# Patient Record
Sex: Female | Born: 1960 | Hispanic: Yes | Marital: Single | Smoking: Never smoker
Health system: Southern US, Community
[De-identification: ages and names within clinical notes are randomized; demographics above are authoritative.]

## PROBLEM LIST (undated history)

## (undated) DIAGNOSIS — E119 Type 2 diabetes mellitus without complications: Secondary | ICD-10-CM

## (undated) DIAGNOSIS — E139 Other specified diabetes mellitus without complications: Secondary | ICD-10-CM

## (undated) DIAGNOSIS — I10 Essential (primary) hypertension: Secondary | ICD-10-CM

## (undated) HISTORY — PX: CHOLECYSTECTOMY: SHX55

## (undated) HISTORY — PX: ABDOMINAL HYSTERECTOMY: SHX81

---

## 2019-08-03 ENCOUNTER — Ambulatory Visit: Payer: Self-pay | Admitting: Family Medicine

## 2019-09-07 ENCOUNTER — Ambulatory Visit: Payer: Self-pay | Attending: Nurse Practitioner | Admitting: Nurse Practitioner

## 2019-09-07 ENCOUNTER — Other Ambulatory Visit: Payer: Self-pay

## 2020-08-07 ENCOUNTER — Encounter: Payer: Self-pay | Admitting: Internal Medicine

## 2020-08-07 ENCOUNTER — Ambulatory Visit: Payer: Self-pay | Admitting: Internal Medicine

## 2020-08-07 VITALS — BP 136/84 | HR 74 | Resp 12 | Ht <= 58 in | Wt 119.0 lb

## 2020-08-07 DIAGNOSIS — R079 Chest pain, unspecified: Secondary | ICD-10-CM

## 2020-08-07 DIAGNOSIS — E119 Type 2 diabetes mellitus without complications: Secondary | ICD-10-CM

## 2020-08-07 MED ORDER — ISOSORBIDE MONONITRATE ER 30 MG PO TB24: 30.0000 mg | ORAL_TABLET | Freq: Every day | ORAL | 4 refills | Status: DC

## 2020-08-07 MED ORDER — METFORMIN HCL ER 500 MG PO TB24: ORAL_TABLET | ORAL | 11 refills | Status: DC

## 2020-08-07 NOTE — Progress Notes (Signed)
Subjective:    Patient ID: Tanya Mason, female   DOB: 12/03/1961, 59 y.o.   MRN: [id number]   HPI   Here to establish Has only been in [place] for 5 days. Trinda Pascal interprets Poor historian  1.  DM:  Diagnosed 10 plus years ago.  Was taking Metformin, but states her doctor in [place] told her the Metformin was causing damage to her heart and switched to Jardiance 25 mg daily.  Has been taking now for 3 months.  Feels very shaky with the Jardiance.  Is checking her sugars:  105-160.  States has checked her sugar when she is shaky and her sugar has always been in a good range--not below 98. Did not have the shaking when on Metformin, but does not want to go back to plain Metformin.   [place] has been in [place].  Was living in [place].  2. Was having pain in RUQ and went to [place] in June to get a second opinion and ultimately had open cholecystectomy in [place] with resolution of pain.  States no one would do anything for her with this pain in [place].    3.  Heart concerns:  States she has a "blockage" in her heart.  Describes diagnosed with CXR.  Also in [place]  She is not aware of having an ECG.  Was placed on Isosorbide mononitrate 40 mg.  When returned to [place], was told she did not have CAD or blockage, so she stopped the Isosorbide.  She felt cold and had a headache, so she restarted and felt better.   4.  Hypercholesterolemia:  Was on medication at one point for this.  5.  No definite history of hypertension  6.  "spot on her lung" : again when returned to [place] and placed on antibiotics.  Cannot say when she finished or what the antibiotic was.  Not clear what symptoms she was having that led her to go to hospital.  Does not seem to be feeling better.   Later, states she had a cough, fever for 2 days prior to going to ED.  She is not having those symptoms.  She does have some back pain.      Current Meds  Medication Sig  . Cholecalciferol  (VITAMIN D3) 25 MCG (1000 UT) CAPS Take by mouth. 1 daily  . empagliflozin (JARDIANCE) 25 MG TABS tablet Take by mouth. 1 twice a day  . isosorbide dinitrate (ISORDIL) 40 MG tablet Take 40 mg by mouth. 1 daily  . OVER THE COUNTER MEDICATION daily. colitisil ( natural constipation tablet)   Allergies  Allergen Reactions  . Penicillins    Past Medical History:  Diagnosis Date  . Diabetes mellitus without complication (HCC)   . Hypertension    Past Surgical History:  Procedure Laterality Date  . ABDOMINAL HYSTERECTOMY    . CHOLECYSTECTOMY     Social History   Tobacco Use  . Smoking status: Never Smoker  . Smokeless tobacco: Never Used  Substance Use Topics  . Alcohol use: Never  . Drug use: Never      Review of Systems    Objective:   BP 136/84 (BP Location: Left Arm, Patient Position: Sitting, Cuff Size: Normal)   Pulse 74   Resp 12   Ht 4\' 10"  (1.473 m)   Wt 119 lb (54 kg)   LMP  (LMP Unknown)   BMI 24.87 kg/m   Physical Exam  NAD  HEENT:  PERRL, EOMI, TMs pearly gray, throat without injection Neck:  Supple, No adenopathy.  Nodular thyroid bilaterally Chest:  CTA CV: RRR with normal S1 and S2, No S3, S4 or murmur.  No carotid bruits.  Carotid, radial and DP pulses normal and equal Abd:  S, NT, No HSM or mass, + BS LE:  No edema  Assessment & Plan  1.  DM:  Start Metformin extended release 500 mg twice daily.  Return for fasting labs in next week:  FLP, CBC, CMP, A1C.  2.  History of chest pain/"spot on lung" and evaluation/hx of hypercholesterolemia:  Send for those records in [place] and if possible, [place]  Fill Imdur for now.  3.  Nodular goiter:  TSH.  Will need orange card for ultrasound, but has not lived in [place] 3 months yet to apply.  4.  HM:  Refused Covid and flu

## 2020-08-08 ENCOUNTER — Telehealth: Payer: Self-pay | Admitting: Internal Medicine

## 2020-08-11 NOTE — Telephone Encounter (Signed)
error 

## 2020-08-14 ENCOUNTER — Other Ambulatory Visit: Payer: Self-pay

## 2020-08-14 DIAGNOSIS — Z79899 Other long term (current) drug therapy: Secondary | ICD-10-CM

## 2020-08-14 DIAGNOSIS — Z13220 Encounter for screening for lipoid disorders: Secondary | ICD-10-CM

## 2020-08-14 DIAGNOSIS — E119 Type 2 diabetes mellitus without complications: Secondary | ICD-10-CM

## 2020-08-14 DIAGNOSIS — E041 Nontoxic single thyroid nodule: Secondary | ICD-10-CM

## 2020-08-14 DIAGNOSIS — E559 Vitamin D deficiency, unspecified: Secondary | ICD-10-CM

## 2020-08-15 LAB — SPECIMEN STATUS REPORT

## 2020-08-15 LAB — COMPREHENSIVE METABOLIC PANEL
ALT: 12 IU/L (ref 0–32)
AST: 22 IU/L (ref 0–40)
Albumin/Globulin Ratio: 1.5 (ref 1.2–2.2)
Albumin: 4.3 g/dL (ref 3.8–4.8)
Alkaline Phosphatase: 66 IU/L (ref 48–121)
BUN/Creatinine Ratio: 19 (ref 12–28)
BUN: 15 mg/dL (ref 8–27)
Bilirubin Total: 0.3 mg/dL (ref 0.0–1.2)
CO2: 26 mmol/L (ref 20–29)
Calcium: 9.1 mg/dL (ref 8.7–10.3)
Chloride: 103 mmol/L (ref 96–106)
Creatinine, Ser: 0.77 mg/dL (ref 0.57–1.00)
GFR calc Af Amer: 96 mL/min/{1.73_m2} (ref >59)
GFR calc non Af Amer: 84 mL/min/{1.73_m2} (ref >59)
Globulin, Total: 2.9 g/dL (ref 1.5–4.5)
Glucose: 89 mg/dL (ref 65–99)
Potassium: 3.9 mmol/L (ref 3.5–5.2)
Sodium: 142 mmol/L (ref 134–144)
Total Protein: 7.2 g/dL (ref 6.0–8.5)

## 2020-08-15 LAB — MICROALBUMIN / CREATININE URINE RATIO
Creatinine, Urine: 107.8 mg/dL
Microalb/Creat Ratio: 4 mg/g creat (ref 0–29)
Microalbumin, Urine: 4.1 ug/mL

## 2020-08-15 LAB — CBC WITH DIFFERENTIAL/PLATELET
Basophils Absolute: 0.1 10*3/uL (ref 0.0–0.2)
Basos: 1 %
EOS (ABSOLUTE): 0.1 10*3/uL (ref 0.0–0.4)
Eos: 1 %
Hematocrit: 40.9 % (ref 34.0–46.6)
Hemoglobin: 12.6 g/dL (ref 11.1–15.9)
Immature Grans (Abs): 0 10*3/uL (ref 0.0–0.1)
Immature Granulocytes: 0 %
Lymphocytes Absolute: 2.5 10*3/uL (ref 0.7–3.1)
Lymphs: 48 %
MCH: 26.4 pg — ABNORMAL LOW (ref 26.6–33.0)
MCHC: 30.8 g/dL — ABNORMAL LOW (ref 31.5–35.7)
MCV: 86 fL (ref 79–97)
Monocytes Absolute: 0.3 10*3/uL (ref 0.1–0.9)
Monocytes: 6 %
Neutrophils Absolute: 2.3 10*3/uL (ref 1.4–7.0)
Neutrophils: 44 %
Platelets: 326 10*3/uL (ref 150–450)
RBC: 4.77 x10E6/uL (ref 3.77–5.28)
RDW: 13.3 % (ref 11.7–15.4)
WBC: 5.3 10*3/uL (ref 3.4–10.8)

## 2020-08-15 LAB — LIPID PANEL W/O CHOL/HDL RATIO
Cholesterol, Total: 195 mg/dL (ref 100–199)
HDL: 66 mg/dL (ref >39)
LDL Chol Calc (NIH): 115 mg/dL — ABNORMAL HIGH (ref 0–99)
Triglycerides: 78 mg/dL (ref 0–149)
VLDL Cholesterol Cal: 14 mg/dL (ref 5–40)

## 2020-08-15 LAB — VITAMIN D 25 HYDROXY (VIT D DEFICIENCY, FRACTURES): Vit D, 25-Hydroxy: 29.9 ng/mL — ABNORMAL LOW (ref 30.0–100.0)

## 2020-08-15 LAB — TSH: TSH: 0.734 u[IU]/mL (ref 0.450–4.500)

## 2020-08-15 LAB — HGB A1C W/O EAG: Hgb A1c MFr Bld: 5.7 % — ABNORMAL HIGH (ref 4.8–5.6)

## 2020-09-22 ENCOUNTER — Encounter (HOSPITAL_COMMUNITY): Payer: Self-pay

## 2020-09-22 ENCOUNTER — Other Ambulatory Visit: Payer: Self-pay

## 2020-09-22 ENCOUNTER — Emergency Department (HOSPITAL_COMMUNITY)
Admission: EM | Admit: 2020-09-22 | Discharge: 2020-09-23 | Disposition: A | Discharge status: Home / Self Care | Payer: Medicaid Other | Attending: Emergency Medicine | Admitting: Emergency Medicine

## 2020-09-22 DIAGNOSIS — I10 Essential (primary) hypertension: Secondary | ICD-10-CM | POA: Insufficient documentation

## 2020-09-22 DIAGNOSIS — K2900 Acute gastritis without bleeding: Secondary | ICD-10-CM | POA: Insufficient documentation

## 2020-09-22 DIAGNOSIS — E119 Type 2 diabetes mellitus without complications: Secondary | ICD-10-CM | POA: Insufficient documentation

## 2020-09-22 DIAGNOSIS — Z7984 Long term (current) use of oral hypoglycemic drugs: Secondary | ICD-10-CM | POA: Insufficient documentation

## 2020-09-22 HISTORY — DX: Type 2 diabetes mellitus without complications: E11.9

## 2020-09-22 HISTORY — DX: Essential (primary) hypertension: I10

## 2020-09-22 LAB — COMPREHENSIVE METABOLIC PANEL WITH GFR
ALT: 20 U/L (ref 0–44)
AST: 24 U/L (ref 15–41)
Albumin: 4.2 g/dL (ref 3.5–5.0)
Alkaline Phosphatase: 57 U/L (ref 38–126)
Anion gap: 10 (ref 5–15)
BUN: 15 mg/dL (ref 8–23)
CO2: 24 mmol/L (ref 22–32)
Calcium: 9.2 mg/dL (ref 8.9–10.3)
Chloride: 104 mmol/L (ref 98–111)
Creatinine, Ser: 0.7 mg/dL (ref 0.44–1.00)
GFR calc Af Amer: >60 mL/min
GFR calc non Af Amer: >60 mL/min
Glucose, Bld: 95 mg/dL (ref 70–99)
Potassium: 3.8 mmol/L (ref 3.5–5.1)
Sodium: 138 mmol/L (ref 135–145)
Total Bilirubin: 0.2 mg/dL — ABNORMAL LOW (ref 0.3–1.2)
Total Protein: 7.6 g/dL (ref 6.5–8.1)

## 2020-09-22 LAB — CBC
HCT: 41 % (ref 36.0–46.0)
Hemoglobin: 12.8 g/dL (ref 12.0–15.0)
MCH: 26.6 pg (ref 26.0–34.0)
MCHC: 31.2 g/dL (ref 30.0–36.0)
MCV: 85.2 fL (ref 80.0–100.0)
Platelets: 359 10*3/uL (ref 150–400)
RBC: 4.81 MIL/uL (ref 3.87–5.11)
RDW: 14.1 % (ref 11.5–15.5)
WBC: 6.3 10*3/uL (ref 4.0–10.5)
nRBC: 0 % (ref 0.0–0.2)

## 2020-09-22 LAB — URINALYSIS, ROUTINE W REFLEX MICROSCOPIC
Bacteria, UA: NONE SEEN
Bilirubin Urine: NEGATIVE
Glucose, UA: NEGATIVE mg/dL
Ketones, ur: NEGATIVE mg/dL
Leukocytes,Ua: NEGATIVE
Nitrite: NEGATIVE
Protein, ur: NEGATIVE mg/dL
Specific Gravity, Urine: 1.002 — ABNORMAL LOW (ref 1.005–1.030)
pH: 6 (ref 5.0–8.0)

## 2020-09-22 LAB — LIPASE, BLOOD: Lipase: 50 U/L (ref 11–51)

## 2020-09-22 MED ORDER — ONDANSETRON HCL 4 MG/2ML IJ SOLN
4.0000 mg | Freq: Once | INTRAMUSCULAR | Status: AC
  Administered 2020-09-22: 4 mg via INTRAVENOUS
  Filled 2020-09-22: qty 2

## 2020-09-22 MED ORDER — FAMOTIDINE IN NACL 20-0.9 MG/50ML-% IV SOLN
20.0000 mg | INTRAVENOUS | Status: AC
  Administered 2020-09-22: 20 mg via INTRAVENOUS
  Filled 2020-09-22: qty 50

## 2020-09-22 MED ORDER — FENTANYL CITRATE (PF) 100 MCG/2ML IJ SOLN
50.0000 ug | Freq: Once | INTRAMUSCULAR | Status: AC
  Administered 2020-09-22: 50 ug via INTRAVENOUS
  Filled 2020-09-22: qty 2

## 2020-09-22 MED ORDER — SODIUM CHLORIDE 0.9 % IV BOLUS
1000.0000 mL | Freq: Once | INTRAVENOUS | Status: AC
  Administered 2020-09-22: 1000 mL via INTRAVENOUS

## 2020-09-22 NOTE — ED Provider Notes (Signed)
[facility] EMERGENCY DEPARTMENT Provider Note   CSN: [id number] Arrival date & time: 09/22/20  1055     History Chief Complaint  Patient presents with  . Abdominal Pain    Tanya Mason is a 59 y.o. female.  The history is provided by the patient and medical records. The history is limited by a language barrier. A language interpreter was used.  Abdominal Pain Associated symptoms: nausea      59 year old female with history of diabetes and hypertension, presenting to the ED for diffuse abdominal pain.  States about 1 month ago she had a cholecystectomy in [place] and since then she has had ongoing pain in her epigastrium radiating down to her lower abdomen.  States her entire stomach feels inflamed.  She reports feeling like there is an acid taste in her mouth all the time.  She tries to eat and drink but has increased pain so is not been doing much of that.  States she feels nauseated but has not had any vomiting.  No diarrhea.  She has also had prior hysterectomy.  She has been taking Tylenol for her pain without relief.  Past Medical History:  Diagnosis Date  . Diabetes mellitus without complication (HCC)   . Hypertension     There are no problems to display for this patient.   Past Surgical History:  Procedure Laterality Date  . ABDOMINAL HYSTERECTOMY    . CHOLECYSTECTOMY       OB History   No obstetric history on file.     History reviewed. No pertinent family history.  Social History   Tobacco Use  . Smoking status: Never Smoker  . Smokeless tobacco: Never Used  Substance Use Topics  . Alcohol use: Never  . Drug use: Never    Home Medications Prior to Admission medications   Medication Sig Start Date End Date Taking? Authorizing Provider  Cholecalciferol (VITAMIN D3) 25 MCG (1000 UT) CAPS Take by mouth. 1 daily    [provider]  empagliflozin (JARDIANCE) 25 MG TABS tablet Take by mouth. 1 twice a day     [provider]  isosorbide dinitrate (ISORDIL) 40 MG tablet Take 40 mg by mouth. 1 daily    [provider]  isosorbide mononitrate (IMDUR) 30 MG 24 hr tablet Take 1 tablet (30 mg total) by mouth daily. 08/07/20   Julieanne Manson, MD  metFORMIN (GLUCOPHAGE-XR) 500 MG 24 hr tablet 1 tab by mouth twice daily with food. 08/07/20   Julieanne Manson, MD  OVER THE COUNTER MEDICATION daily. colitisil ( natural constipation tablet)    [provider]    Allergies    Penicillins  Review of Systems   Review of Systems  Gastrointestinal: Positive for abdominal pain and nausea.  All other systems reviewed and are negative.   Physical Exam Updated Vital Signs BP 139/62   Pulse 64   Temp 98.3 F (36.8 C) (Oral)   Resp 18   Ht 5\' 2"  (1.575 m)   Wt 54.4 kg   LMP  (LMP Unknown)   SpO2 100%   BMI 21.95 kg/m   Physical Exam Vitals and nursing note reviewed.  Constitutional:      Appearance: She is well-developed.  HENT:     Head: Normocephalic and atraumatic.  Eyes:     Conjunctiva/sclera: Conjunctivae normal.     Pupils: Pupils are equal, round, and reactive to light.  Cardiovascular:     Rate and Rhythm: Normal rate  and regular rhythm.     Heart sounds: Normal heart sounds.  Pulmonary:     Effort: Pulmonary effort is normal.     Breath sounds: Normal breath sounds.  Abdominal:     General: Bowel sounds are normal.     Palpations: Abdomen is soft.     Tenderness: There is no abdominal tenderness. There is no guarding or rebound.     Comments: Cholecystectomy scar noted, well-healed, abdomen soft and nontender, normal bowel sounds  Musculoskeletal:        General: Normal range of motion.     Cervical back: Normal range of motion.  Skin:    General: Skin is warm and dry.  Neurological:     Mental Status: She is alert and oriented to person, place, and time.     ED Results / Procedures / Treatments   Labs (all labs ordered are listed, but only  abnormal results are displayed) Labs Reviewed  COMPREHENSIVE METABOLIC PANEL - Abnormal; Notable for the following components:      Result Value   Total Bilirubin 0.2 (*)    All other components within normal limits  URINALYSIS, ROUTINE W REFLEX MICROSCOPIC - Abnormal; Notable for the following components:   Color, Urine COLORLESS (*)    Specific Gravity, Urine 1.002 (*)    Hgb urine dipstick MODERATE (*)    All other components within normal limits  LIPASE, BLOOD  CBC    EKG None  Radiology CT ABDOMEN PELVIS W CONTRAST  Result Date: 09/23/2020 CLINICAL DATA:  Generalized abdominal pain and swelling, recent cholecystectomy EXAM: CT ABDOMEN AND PELVIS WITH CONTRAST TECHNIQUE: Multidetector CT imaging of the abdomen and pelvis was performed using the standard protocol following bolus administration of intravenous contrast. CONTRAST:  OMNIPAQUE IOHEXOL 300 MG/ML  SOLN COMPARISON:  None. FINDINGS: Lower chest: The visualized heart size within normal limits. No pericardial fluid/thickening. No hiatal hernia. The visualized portions of the lungs are clear. Hepatobiliary: The liver is normal in density without focal abnormality.The main portal vein is patent. The patient is status post cholecystectomy. No biliary ductal dilation. Pancreas: Unremarkable. No pancreatic ductal dilatation or surrounding inflammatory changes. Spleen: Normal in size without focal abnormality. Adrenals/Urinary Tract: Both adrenal glands appear normal. The kidneys and collecting system appear normal without evidence of urinary tract calculus or hydronephrosis. Bladder is unremarkable. Stomach/Bowel: There is mild wall thickening seen within the anterior mid body of the stomach with question of mild surrounding fat stranding changes the remainder of the small bowel and colon are unremarkable. There is a moderate amount of colonic stool present throughout. Vascular/Lymphatic: There are no enlarged mesenteric,  retroperitoneal, or pelvic lymph nodes. No significant vascular findings are present. Reproductive: The uterus and adnexa are unremarkable. Other: There is mild fat stranding changes along the anterior right upper abdomen, likely postsurgical. No loculated fluid collections are noted. Rounded metallic foreign bodies within the deep pelvis, likely surgical clips are seen. Musculoskeletal: No acute or significant osseous findings. There is a chronic slight superior compression deformity of the T12 vertebral body. IMPRESSION: Question of mild wall thickening seen along the mid body of the stomach which could be due to mild gastritis. Moderate amount of colonic stool present. Mild postsurgical changes along the anterior right upper abdominal wall. No fluid collections. Electronically Signed   By: Jonna Clark M.D.   On: 09/23/2020 01:18    Procedures Procedures (including critical care time)  Medications Ordered in ED Medications  fentaNYL (SUBLIMAZE) injection 50 mcg (50  mcg Intravenous Given 09/22/20 2340)  sodium chloride 0.9 % bolus 1,000 mL (1,000 mLs Intravenous New Bag/Given 09/22/20 2339)  ondansetron (ZOFRAN) injection 4 mg (4 mg Intravenous Given 09/22/20 2340)  famotidine (PEPCID) IVPB 20 mg premix (0 mg Intravenous Stopped 09/23/20 0021)  LORazepam (ATIVAN) injection 1 mg (1 mg Intravenous Given 09/23/20 0022)  iohexol (OMNIPAQUE) 300 MG/ML solution 100 mL (100 mLs Intravenous Contrast Given 09/23/20 0104)    ED Course  I have reviewed the triage vital signs and the nursing notes.  Pertinent labs & imaging results that were available during my care of the patient were reviewed by me and considered in my medical decision making (see chart for details).    MDM Rules/Calculators/A&P  59 y.o. F here with abdominal pain, nausea, and sensation of acid in her mouth.  She [place] 1 month ago, reports abdominal discomfort ever since.  She is afebrile, non-toxic, NAD.   Abdomen soft, grossly non-tender.  She endorses pain from epigastrium down to lower abdomen.  Well healed surgical scar noted in RUQ.  Labs reassuring.  Given recent surgery, will get CT scan.  Given IVF, zofran, pepcid, fentanyl.  Will reassess.  12:16 AM Patient taken to CT and was brought back by CT tech as she was crying uncontrollably.  I have gone back into the room, she is hysterical on the phone with her family members.  She is screaming, yelling, patting her chest, and continues grabbing her head.  Via interpreter she states "I am dying".  She appears to be having a panic attack.  She states she feels like we are going to kill her somehow.  I have explained to her that medications ordered are perfectly safe with her past medical history, however I will not be able to tell her specifically what is wrong in her abdomen until we get CT scan.  She was given 1 mg Ativan, will reattempt CT.  CT scan with findings of likely gastritis.  This does correlate clinically.  No findings concerning for postoperative infection.  Results discussed with patient and family at bedside via interpreter.  They acknowledge understanding.  Will start on daily Pepcid, Zofran.  Encourage diet modification to limit spicy/acidic foods.  She will be given GI follow-up.  She may return here for any new or acute changes.  Final Clinical Impression(s) / ED Diagnoses Final diagnoses:  Acute gastritis without hemorrhage, unspecified gastritis type    Rx / DC Orders ED Discharge Orders         Ordered    famotidine (PEPCID) 20 MG tablet  Daily        09/23/20 0129    ondansetron (ZOFRAN ODT) 4 MG disintegrating tablet  Every 8 hours PRN        09/23/20 0129           Garlon Hatchet, PA-C 09/23/20 0132    Geoffery Lyons, MD 09/23/20 [phone]

## 2020-09-22 NOTE — ED Triage Notes (Signed)
Pt presents with generalized abd pain, swelling x1 month, mouth taste likes acid, reports she had her gallbladder [place] approx 4 months ago.    Interpreter [phone]

## 2020-09-23 ENCOUNTER — Emergency Department (HOSPITAL_COMMUNITY): Payer: Medicaid Other

## 2020-09-23 MED ORDER — LORAZEPAM 2 MG/ML IJ SOLN: 1.0000 mg | Freq: Once | INTRAMUSCULAR | Status: AC

## 2020-09-23 MED ORDER — ONDANSETRON 4 MG PO TBDP: 4.0000 mg | ORAL_TABLET | Freq: Three times a day (TID) | ORAL | 0 refills | Status: DC | PRN

## 2020-09-23 MED ORDER — LORAZEPAM 2 MG/ML IJ SOLN
INTRAMUSCULAR | Status: AC
  Administered 2020-09-23: 1 mg via INTRAVENOUS
  Filled 2020-09-23: qty 1

## 2020-09-23 MED ORDER — FAMOTIDINE 20 MG PO TABS: 20.0000 mg | ORAL_TABLET | Freq: Every day | ORAL | 0 refills | Status: DC

## 2020-09-23 MED ORDER — IOHEXOL 300 MG/ML  SOLN
100.0000 mL | Freq: Once | INTRAMUSCULAR | Status: AC | PRN
  Administered 2020-09-23: 100 mL via INTRAVENOUS

## 2020-09-23 NOTE — Discharge Instructions (Signed)
Take the prescribed medication as directed. Avoid spicy/acidic foods as it can worsen symptoms.  Avoid NSAIDs (motrin, aleve, ibuprofen, etc). Follow-up with GI specialist-- call in the morning for appointment. Return to the ED for new or worsening symptoms.

## 2020-10-10 ENCOUNTER — Ambulatory Visit: Payer: Self-pay | Admitting: Internal Medicine

## 2020-12-09 ENCOUNTER — Ambulatory Visit: Payer: Self-pay | Admitting: Internal Medicine

## 2020-12-16 ENCOUNTER — Ambulatory Visit: Payer: Self-pay | Admitting: Internal Medicine

## 2021-01-19 ENCOUNTER — Encounter: Payer: Self-pay | Admitting: Neurology

## 2021-02-08 ENCOUNTER — Other Ambulatory Visit: Payer: Self-pay | Admitting: Internal Medicine

## 2021-02-08 NOTE — Telephone Encounter (Signed)
Please check with patient and make sure she is not also taking Isosorbide dinitrate from an old prescription.  I have not written that for her--just the mononitrate.

## 2021-02-09 ENCOUNTER — Encounter: Payer: Self-pay | Admitting: Neurology

## 2021-02-09 ENCOUNTER — Ambulatory Visit (INDEPENDENT_AMBULATORY_CARE_PROVIDER_SITE_OTHER): Payer: Self-pay | Admitting: Neurology

## 2021-02-09 ENCOUNTER — Other Ambulatory Visit: Payer: Self-pay

## 2021-02-09 VITALS — BP 152/83 | HR 93 | Ht 62.0 in | Wt 128.0 lb

## 2021-02-09 DIAGNOSIS — R6889 Other general symptoms and signs: Secondary | ICD-10-CM

## 2021-02-09 DIAGNOSIS — F419 Anxiety disorder, unspecified: Secondary | ICD-10-CM

## 2021-02-09 DIAGNOSIS — F32A Depression, unspecified: Secondary | ICD-10-CM

## 2021-02-09 NOTE — Progress Notes (Signed)
[facility] Neurology Division Clinic Note - Initial Visit   Date: 02/09/21  Tanya Mason MRN: [id number] DOB: 02-27-1961   Dear Tanya Dawley, PA:  Thank you for your kind referral of Tanya Mason for consultation of generalized weakness. Although her history is well known to you, please allow [place] to reiterate it for the purpose of our medical record. The patient was accompanied to the clinic by sister and spanish interpretor who also provides collateral information.     History of Present Illness: Tanya Mason is a 60 y.o. right-handed female with hypertension and diabetes mellitus presenting for evaluation of generalized weakness.  Patient moved from [place] and lives with sister and her family.  She does not work.  She is very scattered with her complaints saying everything is not working.  She has generalized weakness of the arms and legs, tremors, and does not feel well.  Sister says that she cries often.  Patient is tearful throughout the visit today.  She wants to know why she is not well and tired all the time. She is able to do ADLs and walks unassisted.    Out-side paper records, electronic medical record, and images have been reviewed where available and summarized as:  Lab Results  Component Value Date   HGBA1C 5.7 (H) 08/14/2020   No results found for: [id number] Lab Results  Component Value Date   TSH 0.734 08/14/2020    Past Medical History:  Diagnosis Date  . Diabetes mellitus without complication (HCC)   . Hypertension     Past Surgical History:  Procedure Laterality Date  . ABDOMINAL HYSTERECTOMY    . CHOLECYSTECTOMY       Medications:  Outpatient Encounter Medications as of 02/09/2021  Medication Sig  . aspirin EC 81 MG tablet Take 81 mg by mouth daily. Swallow whole.  . Cholecalciferol (VITAMIN D3) 25 MCG (1000 UT) CAPS Take by mouth. 1 daily  . Cholecalciferol 25 MCG (1000 UT) capsule  cholecalciferol (vitamin D3) 25 mcg (1,000 unit) capsule  Take 1 capsule every day by oral route.  . hydrOXYzine (ATARAX/VISTARIL) 25 MG tablet hydroxyzine HCl 25 mg tablet  Take 1 tablet twice a day by oral route as needed.  Marland Kitchen ibuprofen (ADVIL) 200 MG tablet Take 200 mg by mouth every 6 (six) hours as needed.  . isosorbide mononitrate (IMDUR) 30 MG 24 hr tablet Take 1 tablet (30 mg total) by mouth daily.  . Multiple Vitamin (MULTIVITAMIN) tablet Take 1 tablet by mouth daily.  . empagliflozin (JARDIANCE) 25 MG TABS tablet Take by mouth. 1 twice a day (Patient not taking: Reported on 02/09/2021)  . famotidine (PEPCID) 20 MG tablet Take 1 tablet (20 mg total) by mouth daily. (Patient not taking: Reported on 02/09/2021)  . isosorbide dinitrate (ISORDIL) 40 MG tablet Take 40 mg by mouth. 1 daily (Patient not taking: Reported on 02/09/2021)  . metFORMIN (GLUCOPHAGE-XR) 500 MG 24 hr tablet 1 tab by mouth twice daily with food. (Patient not taking: Reported on 02/09/2021)  . ondansetron (ZOFRAN ODT) 4 MG disintegrating tablet Take 1 tablet (4 mg total) by mouth every 8 (eight) hours as needed for nausea. (Patient not taking: Reported on 02/09/2021)  . OVER THE COUNTER MEDICATION daily. colitisil ( natural constipation tablet) (Patient not taking: Reported on 02/09/2021)   No facility-administered encounter medications on file as of 02/09/2021.    Allergies:  Allergies  Allergen Reactions  . Penicillins     Family History: History reviewed.  No pertinent family history.  Social History: Social History   Tobacco Use  . Smoking status: Never Smoker  . Smokeless tobacco: Never Used  Vaping Use  . Vaping Use: Never used  Substance Use Topics  . Alcohol use: Never  . Drug use: Never   Social History   Social History Narrative   Right handed   Lives in a one story home    Does not drink caffeine     Vital Signs:  BP (!) 152/83   Pulse 93   Ht 5\' 2"  (1.575 m)   Wt 128 lb (58.1 kg)   LMP   (LMP Unknown)   SpO2 98%   BMI 23.41 kg/m   Neurological Exam: MENTAL STATUS including orientation to time, place, person, recent and remote memory, attention span and concentration is normal.  She is very tearful throughout the visit and appears depressed. Speech is not dysarthric.  CRANIAL NERVES: II:  No visual field defects.     III-IV-VI: Pupils equal round and reactive to light.  Normal conjugate, extra-ocular eye movements in all directions of gaze.  No nystagmus.  No ptosis.   V:  Normal facial sensation.    VII:  Normal facial symmetry and movements.   VIII:  Normal hearing and vestibular function.   IX-X:  Normal palatal movement.   XI:  Normal shoulder shrug and head rotation.   XII:  Normal tongue strength and range of motion, no deviation or fasciculation.  MOTOR:  Motor strength shows give-way weakness throughout, with repeated testing and encouragement all muscle groups are 5/5.   MSRs:  Right        Left                  brachioradialis 2+  2+  biceps 2+  2+  triceps 2+  2+  patellar 2+  2+  ankle jerk 2+  2+  Hoffman no  no  plantar response down  down   SENSORY:  Normal and symmetric perception of light touch, pinprick, vibration, and proprioception.  Romberg's sign absent.   COORDINATION/GAIT: Normal finger-to- nose-finger.  Finger tapping is slow and irregular. Able to rise from a chair without using arms.  Gait narrow based and stable. Tandem and stressed gait intact.    IMPRESSION: Multiple somatic complaints due to underlying depression/anxiety.  Her neurological exam shows give-way weakness, however, with repeated testing exam is entirely normal and non-focal.  She is very depressed appearing and crying throughout the visit.  I have encouraged her to follow-up with PCP and suggest to see a counselor/psychiatrist to address underlying mood disorder.    Thank you for allowing me to participate in patient's care.  If I can answer any additional questions,  I would be pleased to do so.    Sincerely,    Hafsah Hendler K. , DO

## 2021-02-09 NOTE — Telephone Encounter (Signed)
Spoke with patient and she stated that she is now with a new provider and not longer wish to be seen at [facility]. She did not requested Isosorbide to the pharmacy, but she will follow up with her PCP and ask about it.

## 2021-02-09 NOTE — Telephone Encounter (Signed)
Called patient and left a message asking to call back.  

## 2021-02-09 NOTE — Patient Instructions (Addendum)
Please follow-up with your primary care doctor to discuss your anxiety/depression.

## 2021-02-19 ENCOUNTER — Other Ambulatory Visit: Payer: Self-pay

## 2021-02-19 ENCOUNTER — Encounter (HOSPITAL_COMMUNITY): Payer: Self-pay

## 2021-02-19 ENCOUNTER — Ambulatory Visit (HOSPITAL_COMMUNITY)
Admission: EM | Admit: 2021-02-19 | Discharge: 2021-02-19 | Disposition: A | Discharge status: Home / Self Care | Payer: Medicaid Other | Attending: Emergency Medicine | Admitting: Emergency Medicine

## 2021-02-19 DIAGNOSIS — Z79899 Other long term (current) drug therapy: Secondary | ICD-10-CM | POA: Insufficient documentation

## 2021-02-19 DIAGNOSIS — R112 Nausea with vomiting, unspecified: Secondary | ICD-10-CM | POA: Insufficient documentation

## 2021-02-19 DIAGNOSIS — B349 Viral infection, unspecified: Secondary | ICD-10-CM | POA: Insufficient documentation

## 2021-02-19 DIAGNOSIS — J029 Acute pharyngitis, unspecified: Secondary | ICD-10-CM | POA: Insufficient documentation

## 2021-02-19 DIAGNOSIS — Z7984 Long term (current) use of oral hypoglycemic drugs: Secondary | ICD-10-CM | POA: Insufficient documentation

## 2021-02-19 DIAGNOSIS — R0981 Nasal congestion: Secondary | ICD-10-CM | POA: Insufficient documentation

## 2021-02-19 DIAGNOSIS — Z880 Allergy status to penicillin: Secondary | ICD-10-CM | POA: Insufficient documentation

## 2021-02-19 DIAGNOSIS — Z886 Allergy status to analgesic agent status: Secondary | ICD-10-CM | POA: Insufficient documentation

## 2021-02-19 DIAGNOSIS — Z20822 Contact with and (suspected) exposure to covid-19: Secondary | ICD-10-CM | POA: Insufficient documentation

## 2021-02-19 DIAGNOSIS — Z7982 Long term (current) use of aspirin: Secondary | ICD-10-CM | POA: Insufficient documentation

## 2021-02-19 HISTORY — DX: Other specified diabetes mellitus without complications: E13.9

## 2021-02-19 LAB — POCT RAPID STREP A, ED / UC: Streptococcus, Group A Screen (Direct): NEGATIVE

## 2021-02-19 MED ORDER — ONDANSETRON 4 MG PO TBDP: 4.0000 mg | ORAL_TABLET | Freq: Three times a day (TID) | ORAL | 0 refills | Status: DC | PRN

## 2021-02-19 NOTE — ED Triage Notes (Signed)
Pt presents with nasal congestion and sore throat x 1 week. Pt states she has a runny nose. Pt states she started vomiting this morning.

## 2021-02-19 NOTE — Discharge Instructions (Signed)
Your rapid strep test is negative.  A throat culture is pending; we will call you if it is positive requiring treatment.    Your COVID test is pending.  You should self quarantine until the test result is back.    Take Tylenol or ibuprofen as needed for fever or discomfort.  Rest and keep yourself hydrated.    Take the antinausea medication as directed.  Keep yourself hydrated with clear liquids, such as water, Gatorade, Pedialyte, Sprite, or ginger ale.    Follow up with your primary care provider if your symptoms are not improving.

## 2021-02-19 NOTE — ED Provider Notes (Signed)
[facility]    CSN: [id number] Arrival date & time: 02/19/21  1344      History   Chief Complaint Chief Complaint  Patient presents with  . Nasal Congestion  . Sore Throat  . Vomiting    HPI Tanya Mason is a 60 y.o. female.   Patient presents with 1 week history of runny nose, sinus congestion, sore throat.  She also reports 3 episodes of emesis this morning; she vomited clear mucus.  She denies fever, rash, cough, shortness of breath, diarrhea, or other symptoms.  No treatments attempted at home.  Her medical history includes hypertension and diabetes.  The history is provided by the patient. A language interpreter was used.    Past Medical History:  Diagnosis Date  . Diabetes 1.5, managed as type 2 (HCC)   . Diabetes mellitus without complication (HCC)   . Hypertension     There are no problems to display for this patient.   Past Surgical History:  Procedure Laterality Date  . ABDOMINAL HYSTERECTOMY    . CHOLECYSTECTOMY      OB History   No obstetric history on file.      Home Medications    Prior to Admission medications   Medication Sig Start Date End Date Taking? Authorizing Provider  ondansetron (ZOFRAN ODT) 4 MG disintegrating tablet Take 1 tablet (4 mg total) by mouth every 8 (eight) hours as needed for nausea or vomiting. 02/19/21  Yes Mickie Bail, NP  aspirin EC 81 MG tablet Take 81 mg by mouth daily. Swallow whole.    [provider]  Cholecalciferol (VITAMIN D3) 25 MCG (1000 UT) CAPS Take by mouth. 1 daily    [provider]  Cholecalciferol 25 MCG (1000 UT) capsule cholecalciferol (vitamin D3) 25 mcg (1,000 unit) capsule  Take 1 capsule every day by oral route.    [provider]  empagliflozin (JARDIANCE) 25 MG TABS tablet Take by mouth. 1 twice a day Patient not taking: Reported on 02/09/2021    [provider]  famotidine (PEPCID) 20 MG tablet Take 1 tablet (20 mg total) by mouth  daily. Patient not taking: Reported on 02/09/2021 09/23/20   Garlon Hatchet, PA-C  hydrOXYzine (ATARAX/VISTARIL) 25 MG tablet hydroxyzine HCl 25 mg tablet  Take 1 tablet twice a day by oral route as needed.    [provider]  ibuprofen (ADVIL) 200 MG tablet Take 200 mg by mouth every 6 (six) hours as needed.    [provider]  isosorbide dinitrate (ISORDIL) 40 MG tablet Take 40 mg by mouth. 1 daily Patient not taking: Reported on 02/09/2021    [provider]  isosorbide mononitrate (IMDUR) 30 MG 24 hr tablet Take 1 tablet (30 mg total) by mouth daily. 08/07/20   Julieanne Manson, MD  metFORMIN (GLUCOPHAGE-XR) 500 MG 24 hr tablet 1 tab by mouth twice daily with food. Patient not taking: Reported on 02/09/2021 08/07/20   Julieanne Manson, MD  Multiple Vitamin (MULTIVITAMIN) tablet Take 1 tablet by mouth daily.    [provider]  OVER THE COUNTER MEDICATION daily. colitisil ( natural constipation tablet) Patient not taking: Reported on 02/09/2021    [provider]    Family History History reviewed. No pertinent family history.  Social History Social History   Tobacco Use  . Smoking status: Never Smoker  . Smokeless tobacco: Never Used  Vaping Use  . Vaping Use: Never used  Substance Use Topics  . Alcohol  use: Never  . Drug use: Never     Allergies   Aspirin and Penicillins   Review of Systems Review of Systems  Constitutional: Negative for chills and fever.  HENT: Positive for congestion, rhinorrhea and sore throat. Negative for ear pain.   Eyes: Negative for pain and visual disturbance.  Respiratory: Negative for cough and shortness of breath.   Cardiovascular: Negative for chest pain and palpitations.  Gastrointestinal: Positive for vomiting. Negative for abdominal pain and diarrhea.  Genitourinary: Negative for dysuria and hematuria.  Musculoskeletal: Negative for arthralgias and back pain.  Skin: Negative for color  change and rash.  Neurological: Negative for seizures and syncope.  All other systems reviewed and are negative.    Physical Exam Triage Vital Signs ED Triage Vitals  Enc Vitals Group     BP      Pulse      Resp      Temp      Temp src      SpO2      Weight      Height      Head Circumference      Peak Flow      Pain Score      Pain Loc      Pain Edu?      Excl. in GC?    No data found.  Updated Vital Signs BP 138/79 (BP Location: Right Arm)   Pulse 76   Temp 98.2 F (36.8 C) (Temporal)   Resp 16   LMP  (LMP Unknown)   SpO2 100%   Visual Acuity Right Eye Distance:   Left Eye Distance:   Bilateral Distance:    Right Eye Near:   Left Eye Near:    Bilateral Near:     Physical Exam Vitals and nursing note reviewed.  Constitutional:      General: She is not in acute distress.    Appearance: She is well-developed and well-nourished. She is not ill-appearing.  HENT:     Head: Normocephalic and atraumatic.     Right Ear: Tympanic membrane normal.     Left Ear: Tympanic membrane normal.     Nose: Nose normal.     Mouth/Throat:     Mouth: Mucous membranes are moist.     Pharynx: Posterior oropharyngeal erythema present.  Eyes:     Conjunctiva/sclera: Conjunctivae normal.  Cardiovascular:     Rate and Rhythm: Normal rate and regular rhythm.     Heart sounds: Normal heart sounds.  Pulmonary:     Effort: Pulmonary effort is normal. No respiratory distress.     Breath sounds: Normal breath sounds.  Abdominal:     General: Bowel sounds are normal.     Palpations: Abdomen is soft.     Tenderness: There is no abdominal tenderness. There is no right CVA tenderness, left CVA tenderness, guarding or rebound.  Musculoskeletal:        General: No edema.     Cervical back: Neck supple.  Skin:    General: Skin is warm and dry.     Findings: No rash.  Neurological:     General: No focal deficit present.     Mental Status: She is alert and oriented to person,  place, and time.     Gait: Gait normal.  Psychiatric:        Mood and Affect: Mood and affect and mood normal.        Behavior: Behavior normal.  UC Treatments / Results  Labs (all labs ordered are listed, but only abnormal results are displayed) Labs Reviewed  SARS CORONAVIRUS 2 (TAT 6-24 HRS)  CULTURE, GROUP A STREP [facility])  POCT RAPID STREP A, ED / UC    EKG   Radiology No results found.  Procedures Procedures (including critical care time)  Medications Ordered in UC Medications - No data to display  Initial Impression / Assessment and Plan / UC Course  I have reviewed the triage vital signs and the nursing notes.  Pertinent labs & imaging results that were available during my care of the patient were reviewed by me and considered in my medical decision making (see chart for details).    Viral illness, sore throat, nausea with non-intractable vomiting.  Rapid strep negative; culture pending.  PCR COVID pending.  Instructed patient to self quarantine until the test result is back.  Discussed symptomatic treatment including Tylenol or ibuprofen as needed for fever or discomfort; Zofran as needed for nausea or vomiting; rest, hydration with clear liquids.  Instructed patient to follow-up with her PCP if her symptoms are not improving.  She agrees to plan of care.   Final Clinical Impressions(s) / UC Diagnoses   Final diagnoses:  Viral illness  Sore throat  Non-intractable vomiting with nausea, unspecified vomiting type     Discharge Instructions     Your rapid strep test is negative.  A throat culture is pending; we will call you if it is positive requiring treatment.    Your COVID test is pending.  You should self quarantine until the test result is back.    Take Tylenol or ibuprofen as needed for fever or discomfort.  Rest and keep yourself hydrated.    Take the antinausea medication as directed.  Keep yourself hydrated with clear liquids, such as water,  Gatorade, Pedialyte, Sprite, or ginger ale.    Follow up with your primary care provider if your symptoms are not improving.           ED Prescriptions    Medication Sig Dispense Auth. Provider   ondansetron (ZOFRAN ODT) 4 MG disintegrating tablet Take 1 tablet (4 mg total) by mouth every 8 (eight) hours as needed for nausea or vomiting. 20 tablet Mickie Bail, NP     PDMP not reviewed this encounter.   Mickie Bail, NP 02/19/21 [phone]

## 2021-02-20 LAB — SARS CORONAVIRUS 2 (TAT 6-24 HRS): SARS Coronavirus 2: NEGATIVE

## 2021-02-22 LAB — CULTURE, GROUP A STREP (THRC)

## 2021-03-30 ENCOUNTER — Observation Stay (HOSPITAL_COMMUNITY): Payer: Self-pay

## 2021-03-30 ENCOUNTER — Emergency Department (HOSPITAL_COMMUNITY)
Admission: EM | Admit: 2021-03-30 | Discharge: 2021-03-30 | Disposition: A | Discharge status: Home / Self Care | Payer: Medicaid Other

## 2021-03-30 ENCOUNTER — Emergency Department (HOSPITAL_COMMUNITY): Payer: Self-pay

## 2021-03-30 ENCOUNTER — Encounter (HOSPITAL_COMMUNITY): Payer: Self-pay | Admitting: Internal Medicine

## 2021-03-30 ENCOUNTER — Ambulatory Visit (HOSPITAL_COMMUNITY)
Admission: EM | Admit: 2021-03-30 | Discharge: 2021-03-30 | Payer: Medicaid Other | Attending: Urgent Care | Admitting: Urgent Care

## 2021-03-30 ENCOUNTER — Other Ambulatory Visit: Payer: Self-pay

## 2021-03-30 ENCOUNTER — Encounter (HOSPITAL_COMMUNITY): Payer: Self-pay

## 2021-03-30 ENCOUNTER — Encounter (HOSPITAL_COMMUNITY)
Admission: EM | Disposition: A | Discharge status: Home / Self Care | Payer: Self-pay | Source: Home / Self Care | Attending: Emergency Medicine

## 2021-03-30 ENCOUNTER — Observation Stay (HOSPITAL_COMMUNITY)
Admission: EM | Admit: 2021-03-30 | Discharge: 2021-03-31 | Disposition: A | Discharge status: Home / Self Care | Payer: Self-pay | Attending: Internal Medicine | Admitting: Internal Medicine

## 2021-03-30 DIAGNOSIS — R079 Chest pain, unspecified: Secondary | ICD-10-CM

## 2021-03-30 DIAGNOSIS — R531 Weakness: Secondary | ICD-10-CM | POA: Insufficient documentation

## 2021-03-30 DIAGNOSIS — E119 Type 2 diabetes mellitus without complications: Secondary | ICD-10-CM | POA: Insufficient documentation

## 2021-03-30 DIAGNOSIS — E785 Hyperlipidemia, unspecified: Secondary | ICD-10-CM | POA: Insufficient documentation

## 2021-03-30 DIAGNOSIS — Z20822 Contact with and (suspected) exposure to covid-19: Secondary | ICD-10-CM | POA: Insufficient documentation

## 2021-03-30 DIAGNOSIS — Z7984 Long term (current) use of oral hypoglycemic drugs: Secondary | ICD-10-CM

## 2021-03-30 DIAGNOSIS — M79602 Pain in left arm: Secondary | ICD-10-CM | POA: Insufficient documentation

## 2021-03-30 DIAGNOSIS — I200 Unstable angina: Secondary | ICD-10-CM

## 2021-03-30 DIAGNOSIS — R519 Headache, unspecified: Secondary | ICD-10-CM

## 2021-03-30 DIAGNOSIS — M25512 Pain in left shoulder: Secondary | ICD-10-CM

## 2021-03-30 DIAGNOSIS — Z79899 Other long term (current) drug therapy: Secondary | ICD-10-CM | POA: Insufficient documentation

## 2021-03-30 DIAGNOSIS — I10 Essential (primary) hypertension: Secondary | ICD-10-CM | POA: Insufficient documentation

## 2021-03-30 DIAGNOSIS — E1165 Type 2 diabetes mellitus with hyperglycemia: Secondary | ICD-10-CM

## 2021-03-30 DIAGNOSIS — Z7982 Long term (current) use of aspirin: Secondary | ICD-10-CM | POA: Insufficient documentation

## 2021-03-30 DIAGNOSIS — R5383 Other fatigue: Secondary | ICD-10-CM | POA: Insufficient documentation

## 2021-03-30 DIAGNOSIS — R29898 Other symptoms and signs involving the musculoskeletal system: Secondary | ICD-10-CM

## 2021-03-30 DIAGNOSIS — M25511 Pain in right shoulder: Secondary | ICD-10-CM

## 2021-03-30 DIAGNOSIS — R0789 Other chest pain: Principal | ICD-10-CM

## 2021-03-30 LAB — HEPATIC FUNCTION PANEL
ALT: 19 U/L (ref 0–44)
AST: 21 U/L (ref 15–41)
Albumin: 4 g/dL (ref 3.5–5.0)
Alkaline Phosphatase: 53 U/L (ref 38–126)
Bilirubin, Direct: <0.1 mg/dL (ref 0.0–0.2)
Total Bilirubin: 0.6 mg/dL (ref 0.3–1.2)
Total Protein: 7.5 g/dL (ref 6.5–8.1)

## 2021-03-30 LAB — CBC
HCT: 42.7 % (ref 36.0–46.0)
Hemoglobin: 13.6 g/dL (ref 12.0–15.0)
MCH: 27.5 pg (ref 26.0–34.0)
MCHC: 31.9 g/dL (ref 30.0–36.0)
MCV: 86.4 fL (ref 80.0–100.0)
Platelets: 337 10*3/uL (ref 150–400)
RBC: 4.94 MIL/uL (ref 3.87–5.11)
RDW: 12.9 % (ref 11.5–15.5)
WBC: 6.5 10*3/uL (ref 4.0–10.5)
nRBC: 0 % (ref 0.0–0.2)

## 2021-03-30 LAB — TROPONIN I (HIGH SENSITIVITY)
Troponin I (High Sensitivity): <2 ng/L (ref <18)
Troponin I (High Sensitivity): <2 ng/L (ref <18)

## 2021-03-30 LAB — BASIC METABOLIC PANEL
Anion gap: 9 (ref 5–15)
BUN: 10 mg/dL (ref 8–23)
CO2: 27 mmol/L (ref 22–32)
Calcium: 9.3 mg/dL (ref 8.9–10.3)
Chloride: 102 mmol/L (ref 98–111)
Creatinine, Ser: 0.65 mg/dL (ref 0.44–1.00)
GFR, Estimated: >60 mL/min (ref >60)
Glucose, Bld: 119 mg/dL — ABNORMAL HIGH (ref 70–99)
Potassium: 3.8 mmol/L (ref 3.5–5.1)
Sodium: 138 mmol/L (ref 135–145)

## 2021-03-30 LAB — TSH: TSH: 0.631 u[IU]/mL (ref 0.350–4.500)

## 2021-03-30 LAB — MAGNESIUM: Magnesium: 2.1 mg/dL (ref 1.7–2.4)

## 2021-03-30 LAB — BRAIN NATRIURETIC PEPTIDE: B Natriuretic Peptide: 19.2 pg/mL (ref 0.0–100.0)

## 2021-03-30 LAB — HEMOGLOBIN A1C
Hgb A1c MFr Bld: 6.6 % — ABNORMAL HIGH (ref 4.8–5.6)
Mean Plasma Glucose: 142.72 mg/dL

## 2021-03-30 LAB — CK: Total CK: 66 U/L (ref 38–234)

## 2021-03-30 LAB — RESP PANEL BY RT-PCR (FLU A&B, COVID) ARPGX2
Influenza A by PCR: NEGATIVE
Influenza B by PCR: NEGATIVE
SARS Coronavirus 2 by RT PCR: NEGATIVE

## 2021-03-30 LAB — GLUCOSE, CAPILLARY: Glucose-Capillary: 147 mg/dL — ABNORMAL HIGH (ref 70–99)

## 2021-03-30 LAB — HIV ANTIBODY (ROUTINE TESTING W REFLEX): HIV Screen 4th Generation wRfx: NONREACTIVE

## 2021-03-30 LAB — CBG MONITORING, ED: Glucose-Capillary: 134 mg/dL — ABNORMAL HIGH (ref 70–99)

## 2021-03-30 LAB — SEDIMENTATION RATE: Sed Rate: 16 mm/hr (ref 0–22)

## 2021-03-30 SURGERY — LEFT HEART CATH AND CORONARY ANGIOGRAPHY
Anesthesia: LOCAL

## 2021-03-30 MED ORDER — HYDROXYZINE HCL 25 MG PO TABS: 25.0000 mg | ORAL_TABLET | Freq: Three times a day (TID) | ORAL | Status: DC

## 2021-03-30 MED ORDER — NITROGLYCERIN 0.4 MG SL SUBL
0.4000 mg | SUBLINGUAL_TABLET | SUBLINGUAL | Status: DC | PRN
  Administered 2021-03-30: 0.4 mg via SUBLINGUAL
  Filled 2021-03-30: qty 1

## 2021-03-30 MED ORDER — STROKE: EARLY STAGES OF RECOVERY BOOK: Freq: Once | Status: DC

## 2021-03-30 MED ORDER — LORAZEPAM 2 MG/ML IJ SOLN: 0.5000 mg | Freq: Once | INTRAMUSCULAR | Status: DC

## 2021-03-30 MED ORDER — IBUPROFEN 200 MG PO TABS: 200.0000 mg | ORAL_TABLET | Freq: Four times a day (QID) | ORAL | Status: DC | PRN

## 2021-03-30 MED ORDER — ADULT MULTIVITAMIN W/MINERALS CH
1.0000 | ORAL_TABLET | Freq: Every day | ORAL | Status: DC
  Administered 2021-03-31: 1 via ORAL
  Filled 2021-03-30: qty 1

## 2021-03-30 MED ORDER — ONDANSETRON HCL 4 MG/2ML IJ SOLN: 4.0000 mg | Freq: Four times a day (QID) | INTRAMUSCULAR | Status: DC | PRN

## 2021-03-30 MED ORDER — INSULIN ASPART 100 UNIT/ML ~~LOC~~ SOLN
0.0000 [IU] | Freq: Three times a day (TID) | SUBCUTANEOUS | Status: DC
  Administered 2021-03-31: 15 [IU] via SUBCUTANEOUS

## 2021-03-30 MED ORDER — ENOXAPARIN SODIUM 40 MG/0.4ML ~~LOC~~ SOLN: 40.0000 mg | SUBCUTANEOUS | Status: DC

## 2021-03-30 MED ORDER — ACETAMINOPHEN 325 MG PO TABS
650.0000 mg | ORAL_TABLET | ORAL | Status: DC | PRN
  Administered 2021-03-30 – 2021-03-31 (×4): 650 mg via ORAL
  Filled 2021-03-30 (×4): qty 2

## 2021-03-30 MED ORDER — METOPROLOL SUCCINATE ER 25 MG PO TB24
25.0000 mg | ORAL_TABLET | Freq: Every day | ORAL | Status: DC
  Administered 2021-03-30: 25 mg via ORAL
  Filled 2021-03-30: qty 1

## 2021-03-30 MED ORDER — ISOSORBIDE MONONITRATE ER 30 MG PO TB24
30.0000 mg | ORAL_TABLET | Freq: Every day | ORAL | Status: DC
  Administered 2021-03-31: 30 mg via ORAL
  Filled 2021-03-30: qty 1

## 2021-03-30 MED ORDER — ASPIRIN EC 81 MG PO TBEC
81.0000 mg | DELAYED_RELEASE_TABLET | Freq: Every day | ORAL | Status: DC
  Administered 2021-03-31: 81 mg via ORAL
  Filled 2021-03-30: qty 1

## 2021-03-30 MED ORDER — LIDOCAINE 5 % EX PTCH
1.0000 | MEDICATED_PATCH | CUTANEOUS | Status: DC
  Administered 2021-03-31: 1 via TRANSDERMAL
  Filled 2021-03-30: qty 1

## 2021-03-30 NOTE — Progress Notes (Signed)
60 year old Hispanic female with type 2 diabetes mellitus, anxiety, presented with chest pain from urgent care.  Initial complaint was 4/10 chest pain with left arm feeling cold.  Initial EKG showed incomplete left bundle branch block.  Given concern for unstable angina in the setting of above EKG, plan was to perform coronary angiography for definitive evaluation.  However, patient has since been chest pain-free.  While awaiting Covid testing, which is still pending, her 2 troponins have resulted negative <2.  EKG is unchanged making this likely to be not a new incomplete left bundle branch block.  Her symptoms are rather vague with chest pain that is constantly present, does not worsen with exertion, relieved with Tylenol, and has been present for several years.  She reports feeling of "passing out" on exercising, but does not specifically relate exertional chest pain symptoms.  At this point, I do not think she is having acute coronary syndrome.  I will cancel plans for coronary angiogram.  She is also seeing neurology for symptoms of weakness and paresthesias.  I will hold off heparin, and plan to perform a stress test tomorrow morning.  If normal, patient can then be discharged from cardiac standpoint.   Elder Negus, MD Pager: [phone] Office: [phone]

## 2021-03-30 NOTE — ED Triage Notes (Signed)
Arrived from UC; c/o neck, shoulder and arm pain x 3 days. EMS endorsed 12 lead + LBBB, denies hx. Denies active CP at the moment, concern for feeling cold on left arm.

## 2021-03-30 NOTE — H&P (Addendum)
History and Physical    Tanya Mason HGD:[id number] DOB: 25-Oct-1961 DOA: 03/30/2021  PCP: Pcp, No (Confirm with patient/family/NH records and if not entered, this has to be entered at [facility] point of entry) Patient coming from: Home  I have personally briefly reviewed patient's old medical records in [place]  Chief Complaint: Multiple complains: chest pain, left sided arm weakness.  HPI: Tanya Mason is a 60 y.o. female with medical history significant of HTN, IIDM, anxiety/depression, panic attack, scented with multiple complaints.  Patient complaint of new onset of on of chest pain for last 4 days.  First episode started on Friday, Monday was advised, on the left aspect of the chest, was nonradiating, associated with feeling of nausea, and palpitations.  Episode last about few hours and subsided by itself.  Then, she developed more frequent episodes almost every day, she said it does not seem there is a correlation of onset of chest pain with any activity, denied any pain at night.  Yesterday, chest pain came back again radiated to bilateral shoulders neck and "left arm feels cold". This morning, woke up with same chest pain and has been persistent.  Lately, over last 4 months she has been experiencing increasing fatigue, muscle ache including bilateral shoulders, bilateral thighs, and occasional back sided headache, dull like.  Denied any blurry vision or double vision.  She went to ED several times discharged home with reassurance.  And she went to PCP in January and February, was diagnosed with panic attack and was started on hydroxyzine as needed as well as intention of starting SSRI however patient declined due to concern about suicidal risk.  Patient denied any suicidal ideation today.  Daughter reported patient started to have chest pain since Friday.  Always happens at daytime, usually started with arm pain.  ED Course: Trop negative x2. EKG LBBB (no  previous EKG to compare with)  Review of Systems: As per HPI otherwise 14 point review of systems negative.    Past Medical History:  Diagnosis Date  . Diabetes 1.5, managed as type 2 ([place])   . Diabetes mellitus without complication ([place])   . Hypertension     Past Surgical History:  Procedure Laterality Date  . ABDOMINAL HYSTERECTOMY    . CHOLECYSTECTOMY       reports that she has never smoked. She has never used smokeless tobacco. She reports that she does not drink alcohol and does not use drugs.  Allergies  Allergen Reactions  . Aspirin Other (See Comments)  . Penicillins Rash    Family History  Family history unknown: Yes     Prior to Admission medications   Medication Sig Start Date End Date Taking? Authorizing Provider  aspirin EC 81 MG tablet Take 81 mg by mouth daily. Swallow whole.    [provider]  Cholecalciferol (VITAMIN D3) 25 MCG (1000 UT) CAPS Take by mouth. 1 daily    [provider]  Cholecalciferol 25 MCG (1000 UT) capsule cholecalciferol (vitamin D3) 25 mcg (1,000 unit) capsule  Take 1 capsule every day by oral route.    [provider]  empagliflozin (JARDIANCE) 25 MG TABS tablet Take by mouth. 1 twice a day Patient not taking: Reported on 02/09/2021    [provider]  famotidine (PEPCID) 20 MG tablet Take 1 tablet (20 mg total) by mouth daily. Patient not taking: Reported on 02/09/2021 09/23/20   Larene Pickett, PA-C  hydrOXYzine (ATARAX/VISTARIL) 25 MG tablet hydroxyzine HCl  25 mg tablet  Take 1 tablet twice a day by oral route as needed.    [provider]  ibuprofen (ADVIL) 200 MG tablet Take 200 mg by mouth every 6 (six) hours as needed.    [provider]  isosorbide dinitrate (ISORDIL) 40 MG tablet Take 40 mg by mouth. 1 daily Patient not taking: Reported on 02/09/2021    [provider]  isosorbide mononitrate (IMDUR) 30 MG 24 hr tablet Take 1 tablet (30 mg total) by mouth daily.  08/07/20   Mack Hook, MD  metFORMIN (GLUCOPHAGE-XR) 500 MG 24 hr tablet 1 tab by mouth twice daily with food. Patient not taking: Reported on 02/09/2021 08/07/20   Mack Hook, MD  Multiple Vitamin (MULTIVITAMIN) tablet Take 1 tablet by mouth daily.    [provider]  ondansetron (ZOFRAN ODT) 4 MG disintegrating tablet Take 1 tablet (4 mg total) by mouth every 8 (eight) hours as needed for nausea or vomiting. 02/19/21   Sharion Balloon, NP  OVER THE COUNTER MEDICATION daily. colitisil ( natural constipation tablet) Patient not taking: Reported on 02/09/2021    [provider]    Physical Exam: Vitals:   03/30/21 1415 03/30/21 1430 03/30/21 1500 03/30/21 1530  BP: (!) 134/92 (!) 151/62 134/65 128/62  Pulse: 67 65 65 70  Resp: (!) 23 (!) 30 15 18   Temp:      TempSrc:      SpO2: 98% 100% 93% 97%  Weight:      Height:        Constitutional: NAD, calm, comfortable Vitals:   03/30/21 1415 03/30/21 1430 03/30/21 1500 03/30/21 1530  BP: (!) 134/92 (!) 151/62 134/65 128/62  Pulse: 67 65 65 70  Resp: (!) 23 (!) 30 15 18   Temp:      TempSrc:      SpO2: 98% 100% 93% 97%  Weight:      Height:       Eyes: PERRL, lids and conjunctivae normal ENMT: Mucous membranes are moist. Posterior pharynx clear of any exudate or lesions.Normal dentition.  Neck: normal, supple, no masses, no thyromegaly. Tenderness on B/L shoulders Respiratory: clear to auscultation bilaterally, no wheezing, no crackles. Normal respiratory effort. No accessory muscle use.  Cardiovascular: Regular rate and rhythm, no murmurs / rubs / gallops. No extremity edema. 2+ pedal pulses. No carotid bruits. Severe tenderness on left sided chest wall Abdomen: no tenderness, no masses palpated. No hepatosplenomegaly. Bowel sounds positive.  Musculoskeletal: no clubbing / cyanosis. No joint deformity upper and lower extremities. Good ROM, no contractures. Normal muscle tone.  Skin: no rashes, lesions,  ulcers. No induration Neurologic: CN 2-12 grossly intact. Sensation intact, DTR normal. Slight weakness of left arm compared to right side.  Psychiatric: Normal judgment and insight. Alert and oriented x 3. Normal mood.     Labs on Admission: I have personally reviewed following labs and imaging studies  CBC: Recent Labs  Lab 03/30/21 1136  WBC 6.5  HGB 13.6  HCT 42.7  MCV 86.4  PLT 262   Basic Metabolic Panel: Recent Labs  Lab 03/30/21 1136 03/30/21 1342  NA 138  --   K 3.8  --   CL 102  --   CO2 27  --   GLUCOSE 119*  --   BUN 10  --   CREATININE 0.65  --   CALCIUM 9.3  --   MG  --  2.1   GFR: Estimated Creatinine Clearance: 57.7 mL/min (by C-G formula  based on SCr of 0.65 mg/dL). Liver Function Tests: Recent Labs  Lab 03/30/21 1342  AST 21  ALT 19  ALKPHOS 53  BILITOT 0.6  PROT 7.5  ALBUMIN 4.0   No results for input(s): LIPASE, AMYLASE in the last 168 hours. No results for input(s): AMMONIA in the last 168 hours. Coagulation Profile: No results for input(s): INR, PROTIME in the last 168 hours. Cardiac Enzymes: No results for input(s): CKTOTAL, CKMB, CKMBINDEX, TROPONINI in the last 168 hours. BNP (last 3 results) No results for input(s): PROBNP in the last 8760 hours. HbA1C: Recent Labs    03/30/21 1349  HGBA1C 6.6*   CBG: Recent Labs  Lab 03/30/21 1007  GLUCAP 134*   Lipid Profile: No results for input(s): CHOL, HDL, LDLCALC, TRIG, CHOLHDL, LDLDIRECT in the last 72 hours. Thyroid Function Tests: Recent Labs    03/30/21 1349  TSH 0.631   Anemia Panel: No results for input(s): VITAMINB12, FOLATE, FERRITIN, TIBC, IRON, RETICCTPCT in the last 72 hours. Urine analysis:    Component Value Date/Time   COLORURINE COLORLESS (A) 09/22/2020 1201   APPEARANCEUR CLEAR 09/22/2020 1201   LABSPEC 1.002 (L) 09/22/2020 1201   PHURINE 6.0 09/22/2020 1201   GLUCOSEU NEGATIVE 09/22/2020 1201   HGBUR MODERATE (A) 09/22/2020 1201   BILIRUBINUR  NEGATIVE 09/22/2020 1201   KETONESUR NEGATIVE 09/22/2020 1201   PROTEINUR NEGATIVE 09/22/2020 1201   NITRITE NEGATIVE 09/22/2020 1201   LEUKOCYTESUR NEGATIVE 09/22/2020 1201    Radiological Exams on Admission: DG Chest 2 View  Result Date: 03/30/2021 CLINICAL DATA:  Chest pain Shoulder and arm pain for 3 days EXAM: CHEST - 2 VIEW COMPARISON:  None. FINDINGS: The heart size and mediastinal contours are within normal limits. Both lungs are clear. The visualized skeletal structures are unremarkable. IMPRESSION: No active cardiopulmonary disease. Electronically Signed   By: Miachel Roux M.D.   On: 03/30/2021 13:58    EKG: Independently reviewed. LBBB  Assessment/Plan Active Problems:   Chest pain  (please populate well all problems here in Problem List. (For example, if patient is on BP meds at home and you resume or decide to hold them, it is a problem that needs to be her. Same for CAD, COPD, HLD and so on)  Atypical chest pain -ACS ruled out -Stress test tomorrow as per cardiology -Does have features of costochondritis given the severe tenderness on palpation. Will give Lidocaine patch. -Check lipid panel. -Continue ASA  Left arm pain and weakness -D/W Neuro attending at bedside, TIA workup probably. -ASA, check lipid panel -Can not fully ruled out conversion disorder/somatization, recommend outpatient psychiatry evaluation.  Generalized weakness, fatigue and muscle aching -PCP's impression was probably due to anxiety depression, other etiologies such as fibromyalgia, and/or PMR cannot be ruled out.  TSH within normal limits today.  Will check ESR. -Psychiatry/psychology reason cannot be ruled out, recommend outpatient psychiatry follow-up to rule out conversion/somatization disorder.  Should and neck pain -With tenderness on physical exam, suspect some level of cervical spine radiculopathy, ordered cervical Xray. -Outpatient PT evaluation.  Hx of Panic attack -Continue daily  hydroxyzine.  IIDM -On Metformin, will hold Metformin for now for not knowing Neuro or Cardio's plan to do CTA. -Check A1C  -Sliding scale while in hospital.  Anxiety depression -She declined SSRI offer from PCP recently (Office record on Care Everywhere)  DVT prophylaxis: Lovenox Code Status: Full code Family Communication: Daughter over phone Disposition Plan: Expect less than 24 hours hospital stay, to allow cardio and neuro work-up  Consults called: Cardiology and Neurology Admission status: Tele Obs   Lequita Halt MD [facility] Pager [phone]  03/30/2021, 4:24 PM

## 2021-03-30 NOTE — Progress Notes (Signed)
Pt refusing to do MRI. She said it multiple times that she does not want to do it. She cannot handle the noise and she has a headache. She also stated that she did not understand why they were doing an MRI, that her head ache could be do to her high blood pressure or diabetes. I did tell her to make her nurse and doctor aware that she did not want to do the exam, for now we would be sending her back to her room.

## 2021-03-30 NOTE — ED Provider Notes (Signed)
[facility]   MRN: [id number] DOB: [birth year]  Subjective:   Tanya Mason is a 60 y.o. female presenting for 3 to 5-day history of persistent intermittent left-sided chest pain, left arm pain, bilateral neck pain, feeling cold chills in her hand and lips.  Reports that she had an episode of diaphoresis this morning as well. Currently her chest pain is rated at 4/10, constant, penetrating left chest pain.  Patient is a diabetic, reports that her blood sugar has been in the 200s lately.  Last check was this morning and was 106. Takes metformin for her diabetes. Regarding her heart history, patient reports that she take isosorbide dinitrate 1/2 tablet every night. States that this is for her heart but cannot recall if she's had an MI or any other kind of heart condition. She currently does not have a PCP, is here from [place] for about 3 months and plans on staying here. Does not have a PCP yet but did set up an appointment.   No current facility-administered medications for this encounter.  Current Outpatient Medications:  .  aspirin EC 81 MG tablet, Take 81 mg by mouth daily. Swallow whole., Disp: , Rfl:  .  Cholecalciferol (VITAMIN D3) 25 MCG (1000 UT) CAPS, Take by mouth. 1 daily, Disp: , Rfl:  .  Cholecalciferol 25 MCG (1000 UT) capsule, cholecalciferol (vitamin D3) 25 mcg (1,000 unit) capsule  Take 1 capsule every day by oral route., Disp: , Rfl:  .  empagliflozin (JARDIANCE) 25 MG TABS tablet, Take by mouth. 1 twice a day (Patient not taking: Reported on 02/09/2021), Disp: , Rfl:  .  famotidine (PEPCID) 20 MG tablet, Take 1 tablet (20 mg total) by mouth daily. (Patient not taking: Reported on 02/09/2021), Disp: 30 tablet, Rfl: 0 .  hydrOXYzine (ATARAX/VISTARIL) 25 MG tablet, hydroxyzine HCl 25 mg tablet  Take 1 tablet twice a day by oral route as needed., Disp: , Rfl:  .  ibuprofen (ADVIL) 200 MG tablet, Take 200 mg by mouth every 6 (six) hours as needed., Disp: ,  Rfl:  .  isosorbide dinitrate (ISORDIL) 40 MG tablet, Take 40 mg by mouth. 1 daily (Patient not taking: Reported on 02/09/2021), Disp: , Rfl:  .  isosorbide mononitrate (IMDUR) 30 MG 24 hr tablet, Take 1 tablet (30 mg total) by mouth daily., Disp: 30 tablet, Rfl: 4 .  metFORMIN (GLUCOPHAGE-XR) 500 MG 24 hr tablet, 1 tab by mouth twice daily with food. (Patient not taking: Reported on 02/09/2021), Disp: 60 tablet, Rfl: 11 .  Multiple Vitamin (MULTIVITAMIN) tablet, Take 1 tablet by mouth daily., Disp: , Rfl:  .  ondansetron (ZOFRAN ODT) 4 MG disintegrating tablet, Take 1 tablet (4 mg total) by mouth every 8 (eight) hours as needed for nausea or vomiting., Disp: 20 tablet, Rfl: 0 .  OVER THE COUNTER MEDICATION, daily. colitisil ( natural constipation tablet) (Patient not taking: Reported on 02/09/2021), Disp: , Rfl:    Allergies  Allergen Reactions  . Aspirin Other (See Comments)  . Penicillins     Past Medical History:  Diagnosis Date  . Diabetes 1.5, managed as type 2 (HCC)   . Diabetes mellitus without complication (HCC)   . Hypertension      Past Surgical History:  Procedure Laterality Date  . ABDOMINAL HYSTERECTOMY    . CHOLECYSTECTOMY      No family history on file.  Social History   Tobacco Use  . Smoking status: Never Smoker  . Smokeless  tobacco: Never Used  Vaping Use  . Vaping Use: Never used  Substance Use Topics  . Alcohol use: Never  . Drug use: Never    ROS   Objective:   Vitals: BP 133/76 (BP Location: Left Arm)   Pulse 87   Temp 98.3 F (36.8 C) (Oral)   Resp 17   LMP  (LMP Unknown)   SpO2 100%   Physical Exam Constitutional:      General: She is not in acute distress.    Appearance: Normal appearance. She is well-developed. She is not ill-appearing, toxic-appearing or diaphoretic.  HENT:     Head: Normocephalic and atraumatic.     Nose: Nose normal.     Mouth/Throat:     Mouth: Mucous membranes are moist.  Eyes:     Extraocular Movements:  Extraocular movements intact.     Pupils: Pupils are equal, round, and reactive to light.  Cardiovascular:     Rate and Rhythm: Normal rate and regular rhythm.     Pulses: Normal pulses.     Heart sounds: Normal heart sounds. No murmur heard. No friction rub. No gallop.   Pulmonary:     Effort: Pulmonary effort is normal. No respiratory distress.     Breath sounds: Normal breath sounds. No stridor. No wheezing, rhonchi or rales.  Skin:    General: Skin is warm and dry.     Findings: No rash.  Neurological:     Mental Status: She is alert and oriented to person, place, and time.  Psychiatric:        Mood and Affect: Mood normal.        Behavior: Behavior normal.        Thought Content: Thought content normal.     Results for orders placed or performed during the hospital encounter of 03/30/21 (from the past 24 hour(s))  POC CBG monitoring     Status: Abnormal   Collection Time: 03/30/21 10:07 AM  Result Value Ref Range   Glucose-Capillary 134 (H) 70 - 99 mg/dL   Comment 1 Document in Chart     ED ECG REPORT   Date: 03/30/2021  Rate: 75bpm  Rhythm: normal sinus rhythm  QRS Axis: normal  Intervals: normal  ST/T Wave abnormalities: normal  Conduction Disutrbances:left bundle branch block  Narrative Interpretation: Sinus rhythm at 75bpm with LBBB, possible ST elevation in V1-V2. No previous ecg available for comparison.   Old EKG Reviewed: none available  I have personally reviewed the EKG tracing and agree with the computerized printout as noted.   Assessment and Plan :   PDMP not reviewed this encounter.  1. Unstable angina (HCC)   2. Left-sided chest pain   3. Type 2 diabetes mellitus with hyperglycemia, without long-term current use of insulin (HCC)     Case discussed with Dr. Rosemary Holms, cardiologist on call. Will send by EMS to [facility] ER for further evaluation and work up of unstable angina. Held off on aspirin as patient is allergic to this. Also held off on SL  nitroglycerin in an effort to avoid hypotension. Discussed findings with patient and her family member, they are agreeable to transport to the hospital by EMS.    Wallis Bamberg, PA-C 03/30/21 1048

## 2021-03-30 NOTE — Consult Note (Signed)
CARDIOLOGY CONSULT NOTE  Patient ID: Tanya Mason MRN: [id number] DOB/AGE: 1961/07/30 60 y.o.  Admit date: 03/30/2021 Attending physician: Gerhard Munch, MD Primary Physician:  Aviva Kluver Outpatient Cardiologist: NA Inpatient Cardiologist: Tessa Lerner, DO, [facility]  Chief complaint: Chest pain  Requesting physician: Gerhard Munch, MD Reason for consultation: Chest pain  HPI:  Tanya Mason is a 60 y.o. Spanish-speaking female who presents with a chief complaint of " chest pain." Her past medical history and cardiovascular risk factors include: Non-insulin-dependent diabetes mellitus type 2, hypertension, postmenopausal female.  Patient predominately speaks Spanish and is accompanied by her Sister Secundino Ginger who also speaks Spanish and therefore history of present illness is obtained in the presence of Dr. Jeraldine Loots ED physician who served as a Nurse, learning disability during this encounter.  It appears the patient comes in with chest pain and arm pain that been going on since Friday, March 27, 2021.  She states that the left arm pain radiates to the left side of the chest and substernal region predominantly with effort related activities.  The intensity is 4 out of 10, gets better with resting and relaxing.  She seek medical attention today as the pain was getting progressive and started having diaphoresis.  She initially presents to urgent care and was transferred to the hospital via EMS for further evaluation and management.  She has tried Tylenol and Motrin and her symptoms do improve without complete resolution.  Aspirin is noted as one of the allergies.  Patient states that her reaction is runny nose.  She denies angioedema or respiratory distress.  She lives in [place] and came to the [place] 3 months ago and plans to be here long-term.   ALLERGIES: Allergies  Allergen Reactions  . Aspirin Other (See Comments)  . Penicillins     PAST MEDICAL  HISTORY: Past Medical History:  Diagnosis Date  . Diabetes 1.5, managed as type 2 (HCC)   . Diabetes mellitus without complication (HCC)   . Hypertension     PAST SURGICAL HISTORY: Past Surgical History:  Procedure Laterality Date  . ABDOMINAL HYSTERECTOMY    . CHOLECYSTECTOMY      FAMILY HISTORY: Denies family history of premature CAD.    SOCIAL HISTORY:  The patient  reports that she has never smoked. She has never used smokeless tobacco. She reports that she does not drink alcohol and does not use drugs.  MEDICATIONS: Current Outpatient Medications  Medication Instructions  . aspirin EC 81 mg, Oral, Daily, Swallow whole.  . Cholecalciferol (VITAMIN D3) 25 MCG (1000 UT) CAPS Oral, 1 daily   . Cholecalciferol 25 MCG (1000 UT) capsule cholecalciferol (vitamin D3) 25 mcg (1,000 unit) capsule  Take 1 capsule every day by oral route.  . empagliflozin (JARDIANCE) 25 MG TABS tablet Take by mouth. 1 twice a day  . famotidine (PEPCID) 20 mg, Oral, Daily  . hydrOXYzine (ATARAX/VISTARIL) 25 MG tablet hydroxyzine HCl 25 mg tablet  Take 1 tablet twice a day by oral route as needed.  Marland Kitchen ibuprofen (ADVIL) 200 mg, Oral, Every 6 hours PRN  . isosorbide dinitrate (ISORDIL) 40 mg  . isosorbide mononitrate (IMDUR) 30 mg, Oral, Daily  . metFORMIN (GLUCOPHAGE-XR) 500 MG 24 hr tablet 1 tab by mouth twice daily with food.  . Multiple Vitamin (MULTIVITAMIN) tablet 1 tablet, Oral, Daily  . ondansetron (ZOFRAN ODT) 4 mg, Oral, Every 8 hours PRN  . OVER THE COUNTER MEDICATION Daily    REVIEW OF SYSTEMS: Review of Systems  Constitutional: Positive for diaphoresis. Negative for chills and fever.  HENT: Negative for hoarse voice and nosebleeds.   Eyes: Negative for discharge, double vision and pain.  Cardiovascular: Positive for chest pain. Negative for claudication, dyspnea on exertion, leg swelling, near-syncope, orthopnea, palpitations, paroxysmal nocturnal dyspnea and syncope.       Left Arm  pain and cold sensation, diaphoresis  Respiratory: Negative for hemoptysis and shortness of breath.   Musculoskeletal: Negative for muscle cramps and myalgias.  Gastrointestinal: Negative for abdominal pain, constipation, diarrhea, hematemesis, hematochezia, melena, nausea and vomiting.  Neurological: Negative for dizziness and light-headedness.  All other systems reviewed and are negative.  PHYSICAL EXAM: Vitals with BMI 03/30/2021 03/30/2021 03/30/2021  Height - - -  Weight - - -  BMI - - -  Systolic 134 149 741  Diastolic 92 66 82  Pulse 67 66 74    No intake or output data in the 24 hours ending 03/30/21 1435  Net IO Since Admission: No IO data has been entered for this period [03/30/21 1435]  CONSTITUTIONAL: Age-appropriate female, mildly in distress, hemodynamically stable.  SKIN: Skin is warm and dry. No rash noted. No cyanosis. No pallor. No jaundice HEAD: Normocephalic and atraumatic.  EYES: No scleral icterus MOUTH/THROAT: Moist oral membranes.  NECK: No JVD present. No thyromegaly noted. No carotid bruits  LYMPHATIC: No visible cervical adenopathy.  CHEST Normal respiratory effort. No intercostal retractions  LUNGS: Clear to auscultation bilaterally.  No stridor. No wheezes. No rales.  CARDIOVASCULAR: Regular rate and rhythm, positive S1-S2, no murmurs rubs or gallops appreciated. ABDOMINAL: Soft, nontender, nondistended, positive bowel sounds in all 4 quadrants, no apparent ascites.  EXTREMITIES: No peripheral edema, faint posterior tibial pulses bilaterally, 2+ bilateral dorsalis pedis pulses HEMATOLOGIC: No significant bruising NEUROLOGIC: Oriented to person, place, and time. Nonfocal. Normal muscle tone.  PSYCHIATRIC: Normal mood and affect. Normal behavior. Cooperative  RADIOLOGY: DG Chest 2 View  Result Date: 03/30/2021 CLINICAL DATA:  Chest pain Shoulder and arm pain for 3 days EXAM: CHEST - 2 VIEW COMPARISON:  None. FINDINGS: The heart size and mediastinal  contours are within normal limits. Both lungs are clear. The visualized skeletal structures are unremarkable. IMPRESSION: No active cardiopulmonary disease. Electronically Signed   By: Acquanetta Belling M.D.   On: 03/30/2021 13:58    LABORATORY DATA: Lab Results  Component Value Date   WBC 6.5 03/30/2021   HGB 13.6 03/30/2021   HCT 42.7 03/30/2021   MCV 86.4 03/30/2021   PLT 337 03/30/2021    Recent Labs  Lab 03/30/21 1136  NA 138  K 3.8  CL 102  CO2 27  BUN 10  CREATININE 0.65  CALCIUM 9.3  GLUCOSE 119*    Lipid Panel     Component Value Date/Time   CHOL 195 08/14/2020 0859   TRIG 78 08/14/2020 0859   HDL 66 08/14/2020 0859   LDLCALC 115 (H) 08/14/2020 0859    BNP (last 3 results) No results for input(s): BNP in the last 8760 hours.  HEMOGLOBIN A1C Lab Results  Component Value Date   HGBA1C 5.7 (H) 08/14/2020    Cardiac Panel (last 3 results) No results for input(s): CKTOTAL, CKMB, RELINDX in the last 8760 hours.  Invalid input(s): TROPONINHS  No results found for: CKTOTAL, CKMB, CKMBINDEX   TSH Recent Labs    08/14/20 0859  TSH 0.734      CARDIAC DATABASE: EKG: 03/30/2021: Normal sinus rhythm, 75 bpm, left bundle branch block, without underlying injury pattern.  03/30/2021: Normal sinus rhythm, 78 bpm, left bundle branch block, without underlying injury pattern.  Echocardiogram: None  Stress Testing:  None  Heart Catheterization: None  IMPRESSION & RECOMMENDATIONS: Charidy Cappelletti is a 60 y.o. Spanish-speaking female whose past medical history and cardiovascular risk factors include: Non-insulin-dependent diabetes mellitus type 2, hypertension, postmenopausal female.  Impression: Unstable angina. Benign essential hypertension. Non-insulin-dependent diabetes mellitus type 2  Plan: Patient symptoms of discomfort over the last several days appears to be cardiac in nature.  She continues to be in discomfort and had diaphoresis  earlier this morning which prompted her urgent care visit and now transferred to [facility] for further evaluation and management.  EKG shows normal sinus rhythm with underlying left bundle branch block.  No prior EKGs to review for comparison.  Aspirin is listed as one of the allergies.  But she states that her reaction is a runny nose.  She does not have a true allergy based on her description.  Would recommend aspirin 325 mg p.o. x1.  IV heparin drip per pharmacy per ACS protocol  Given her symptoms of unstable angina, active symptoms, left bundle branch block, discussed undergoing left heart catheterization with possible intervention.  In the presence of Dr. Jeraldine Loots who kindly serve as an interpreter during today's encounter helped inform the patient with regards to the risks, benefits, and alternatives. Procedure of left heart catheterization with possible intervention was explained to the patient in detail. Complications include but not limited to bleeding, infection, vascular injury, stroke, myocardial infection, arrhythmia, kidney injury requiring hemodialysis, radiation-related injury in the case of prolonged fluoroscopy use, emergency cardiac surgery, and death. The patient understands the risks of serious complication is 1-2 in 1000 with diagnostic cardiac cath and 1-2% or less with angioplasty/stenting.  The patient and her sister Byrd Hesselbach) voices understanding and provides verbal feedback and wishes to proceed with coronary angiography with possible PCI.  COVID screen pending  Check CMP, CBC, BNP, magnesium, trend troponins, TSH, fasting lipid profile am.  Echocardiogram will be ordered to evaluate for structural heart disease and left ventricular systolic function.  We will start aspirin 81 mg p.o. daily, Toprol-XL 25 mg p.o. daily, Lipitor 80 mg p.o. nightly  We will defer the management of her chronic comorbid conditions to her primary team.  We will follow patient with  you.  CRITICAL CARE Performed by: Tessa Lerner   Total critical care time: 63 minutes   Critical care time was exclusive of separately billable procedures and treating other patients.   Critical care was necessary to treat or prevent imminent or life-threatening deterioration.   Critical care was time spent personally by me on the following activities: development of treatment plan with patient and sister, ER physician Dr. Jeraldine Loots kindly provided interpretation during today's encounter, discussions with consultants, evaluation of patient's response to treatment, examination of patient, obtaining history from patient or surrogate, ordering and performing treatments and interventions, ordering and review of laboratory studies, ordering and review of radiographic studies, pulse oximetry and re-evaluation of patient's condition.  This note was created using a voice recognition software as a result there may be grammatical errors inadvertently enclosed that do not reflect the nature of this encounter. Every attempt is made to correct such errors.  Delilah Shan [facility]  Pager: [phone] Office: [phone] 03/30/2021, 2:35 PM

## 2021-03-30 NOTE — ED Notes (Signed)
Ems has arrived 

## 2021-03-30 NOTE — ED Triage Notes (Signed)
Pt presents with bilateral neck, shoulder, and arm pain X 3 days; pt states "it feels cold", with no relief with OTC medication.

## 2021-03-30 NOTE — Consult Note (Addendum)
Neurology Consultation  Reason for Consult: Suspected Stroke   CC: Left arm weakness and numbness  History is obtained from:patient via tele-interpreter and chart review  HPI: Tanya Mason is a 60 y.o. female presents from urgent care after being referred here following initial presentation for left arm discomfort, chest pain. She was here with her companion. She notes a history of diabetes, hypertension. She has had 1 prior cardiac evaluation, while in [place], seemingly a CT scan presents today due to changes from baseline over the past 4 days. She notes that she has developed a cold sensation throughout her left arm, as well as discomfort in her neck bilaterally and head. Notably, patient also has left chest pain, both associated with the left arm coldness and worse with exertion seemingly the pain has been present since onset about 4 days ago, though with waxing and waning severity, some improvement with Tylenol or Motrin. No vomiting, no abdominal pain, no fever. No dyspnea.  Neurology was consulted 2/2 concern for LUE weakness. She reports diffuse weakness in all extremities x1 yr but feels it is getting worse. Her LUE feels weaker to her than her RUE. She reports lethargy/malaise same time period. She saw neurology as outpatient Feb 2022 who felt sx were psychosomatic and did not recommend further workup. No recent history of trauma. She endorses pain in her neck and lower back which are chronic.   LKW: approx 4/7 tpa given?: no, outside window IR Thrombectomy? No, outside window   ROS: A 14 point ROS was performed and is negative except as noted in the HPI.  Unable to obtain due to altered mental status.   Past Medical History:  Diagnosis Date  . Diabetes 1.5, managed as type 2 (HCC)   . Diabetes mellitus without complication (HCC)   . Hypertension      Family History  Family history unknown: Yes     Social History:   reports that she has never smoked. She  has never used smokeless tobacco. She reports that she does not drink alcohol and does not use drugs.  Medications  Current Facility-Administered Medications:  .  metoprolol succinate (TOPROL-XL) 24 hr tablet 25 mg, 25 mg, Oral, Daily, Tolia, Sunit, DO, 25 mg at 03/30/21 1350 .  nitroGLYCERIN (NITROSTAT) SL tablet 0.4 mg, 0.4 mg, Sublingual, Q5 min PRN, Tolia, Sunit, DO  Current Outpatient Medications:  .  aspirin EC 81 MG tablet, Take 81 mg by mouth daily. Swallow whole., Disp: , Rfl:  .  Cholecalciferol (VITAMIN D3) 25 MCG (1000 UT) CAPS, Take by mouth. 1 daily, Disp: , Rfl:  .  Cholecalciferol 25 MCG (1000 UT) capsule, cholecalciferol (vitamin D3) 25 mcg (1,000 unit) capsule  Take 1 capsule every day by oral route., Disp: , Rfl:  .  empagliflozin (JARDIANCE) 25 MG TABS tablet, Take by mouth. 1 twice a day (Patient not taking: Reported on 02/09/2021), Disp: , Rfl:  .  famotidine (PEPCID) 20 MG tablet, Take 1 tablet (20 mg total) by mouth daily. (Patient not taking: Reported on 02/09/2021), Disp: 30 tablet, Rfl: 0 .  hydrOXYzine (ATARAX/VISTARIL) 25 MG tablet, hydroxyzine HCl 25 mg tablet  Take 1 tablet twice a day by oral route as needed., Disp: , Rfl:  .  ibuprofen (ADVIL) 200 MG tablet, Take 200 mg by mouth every 6 (six) hours as needed., Disp: , Rfl:  .  isosorbide dinitrate (ISORDIL) 40 MG tablet, Take 40 mg by mouth. 1 daily (Patient not taking: Reported on 02/09/2021),  Disp: , Rfl:  .  isosorbide mononitrate (IMDUR) 30 MG 24 hr tablet, Take 1 tablet (30 mg total) by mouth daily., Disp: 30 tablet, Rfl: 4 .  metFORMIN (GLUCOPHAGE-XR) 500 MG 24 hr tablet, 1 tab by mouth twice daily with food. (Patient not taking: Reported on 02/09/2021), Disp: 60 tablet, Rfl: 11 .  Multiple Vitamin (MULTIVITAMIN) tablet, Take 1 tablet by mouth daily., Disp: , Rfl:  .  ondansetron (ZOFRAN ODT) 4 MG disintegrating tablet, Take 1 tablet (4 mg total) by mouth every 8 (eight) hours as needed for nausea or vomiting.,  Disp: 20 tablet, Rfl: 0 .  OVER THE COUNTER MEDICATION, daily. colitisil ( natural constipation tablet) (Patient not taking: Reported on 02/09/2021), Disp: , Rfl:    Exam: Current vital signs: BP 134/65   Pulse 65   Temp 98.2 F (36.8 C) (Oral)   Resp 15   Ht 5\' 2"  (1.575 m)   Wt 59.9 kg   LMP  (LMP Unknown)   SpO2 93%   BMI 24.14 kg/m  Vital signs in last 24 hours: Temp:  [98.2 F (36.8 C)-98.3 F (36.8 C)] 98.2 F (36.8 C) (04/11 1134) Pulse Rate:  [65-87] 65 (04/11 1500) Resp:  [15-30] 15 (04/11 1500) BP: (97-152)/(62-102) 134/65 (04/11 1500) SpO2:  [93 %-100 %] 93 % (04/11 1500) Weight:  [59.9 kg] 59.9 kg (04/11 1131)  General: alert and awake, female, somewhat restless  Lungs: Symmetrical Chest rise, no labored breathing  Cardio: Regular Rate and Rhythm  Abdomen: Soft, non-tender  Neuro: Alert, oriented, thought content appropriate.  Speech fluent without evidence of aphasia.  Able to follow all commands without difficulty.  Exam Inconsistent most likely due to effort  Cranial Nerves: II:  Visual fields grossly normal, pupils equal, round, reactive to light and accommodation III,IV, VI: ptosis not present, extra-ocular motions intact bilaterally V,VII: smile symmetric, reports change of sensation in Left V1 and Right V3 VIII: hearing normal bilaterally IX,X: uvula rises symmetrically XI: bilateral shoulder shrug but reports pain with it XII: midline tongue extension without atrophy or fasciculations  Motor: Antigravity in all extremities with giveway weakness. Exam limited 2/2 effort. L grip strength is weaker than R.  Tone and bulk:normal tone throughout; no atrophy noted Sensory: SILT Cerebellar: UTA 2/2 giveway weakness Gait: deferred    Labs I have reviewed labs in epic and the results pertinent to this consultation are:   CBC    Component Value Date/Time   WBC 6.5 03/30/2021 1136   RBC 4.94 03/30/2021 1136   HGB 13.6 03/30/2021 1136   HGB  12.6 08/14/2020 0859   HCT 42.7 03/30/2021 1136   HCT 40.9 08/14/2020 0859   PLT 337 03/30/2021 1136   PLT 326 08/14/2020 0859   MCV 86.4 03/30/2021 1136   MCV 86 08/14/2020 0859   MCH 27.5 03/30/2021 1136   MCHC 31.9 03/30/2021 1136   RDW 12.9 03/30/2021 1136   RDW 13.3 08/14/2020 0859   LYMPHSABS 2.5 08/14/2020 0859   EOSABS 0.1 08/14/2020 0859   BASOSABS 0.1 08/14/2020 0859    CMP     Component Value Date/Time   NA 138 03/30/2021 1136   NA 142 08/14/2020 0859   K 3.8 03/30/2021 1136   CL 102 03/30/2021 1136   CO2 27 03/30/2021 1136   GLUCOSE 119 (H) 03/30/2021 1136   BUN 10 03/30/2021 1136   BUN 15 08/14/2020 0859   CREATININE 0.65 03/30/2021 1136   CALCIUM 9.3 03/30/2021 1136   PROT 7.5 03/30/2021  1342   PROT 7.2 08/14/2020 0859   ALBUMIN 4.0 03/30/2021 1342   ALBUMIN 4.3 08/14/2020 0859   AST 21 03/30/2021 1342   ALT 19 03/30/2021 1342   ALKPHOS 53 03/30/2021 1342   BILITOT 0.6 03/30/2021 1342   BILITOT 0.3 08/14/2020 0859   GFRNONAA >60 03/30/2021 1136   GFRAA >60 09/22/2020 1201    Lipid Panel     Component Value Date/Time   CHOL 195 08/14/2020 0859   TRIG 78 08/14/2020 0859   HDL 66 08/14/2020 0859   LDLCALC 115 (H) 08/14/2020 0859     Imaging  MRI and MR Angio Head & Neck: ordered   Assessment/Impression: Her exam is inconsistent and appears embellished with giveway weakness but underlying true weakness cannot be ruled out. She does have decreased grip strength on L compared to R. MRI brain and c spine indicated to r/o underlying pathology. If negative no further w/u indicated. Patient refused MRI earlier this evening but agreed to try again with prn ativan given beforehand. I told her that if she is unable or unwilling to undergo the MRI she does not have to although that is my recommendation. If she elects not to complete the MRI she may f/u with outpatient neurology if her symptoms worsen. As noted previously they recommended she see a counselor  for her depression which is felt to be significantly contributing to her functional issues.   Recommendations: - MRI brain and c spine wwo  Note was written by Arna Snipe MD resident and edited by me to reflect my findings and recommendations.   Bing Neighbors, MD [facility] [phone]  If 7pm- 7am, please page neurology on call as listed in AMION.

## 2021-03-30 NOTE — ED Notes (Signed)
Pat to MRI at this time 

## 2021-03-30 NOTE — ED Notes (Signed)
Patient is being discharged from the Urgent Care and sent to the Emergency Department via EMS . Per provider Wallis Bamberg, patient is in need of higher level of care due to unstable angina. Patient is aware and verbalizes understanding of plan of care.    Vitals:   03/30/21 0955  BP: 133/76  Pulse: 87  Resp: 17  Temp: 98.3 F (36.8 C)  SpO2: 100%

## 2021-03-30 NOTE — ED Provider Notes (Signed)
[facility] EMERGENCY DEPARTMENT Provider Note   CSN: [id number] Arrival date & time: 03/30/21  1128     History Chief Complaint  Patient presents with  . Arm Pain    Tanya Mason is a 60 y.o. female.  HPI Patient presents from urgent care after being referred here following initial presentation for left arm discomfort, chest pain.  She was here with her companion. History is obtained with the assistance of a translator device. She notes a history of diabetes, hypertension.  She has had 1 prior cardiac evaluation, while in [place], seemingly a CT scan presents today due to changes from baseline over the past 4 days.  She notes that she has developed a cold sensation throughout her left arm, as well as discomfort in her neck bilaterally and head.  Notably, patient also has left chest pain, both associated with the left arm coldness and worse with exertion seemingly the pain has been present since onset about 4 days ago, though with waxing and waning severity, some improvement with Tylenol or Motrin. No vomiting, no abdominal pain, no fever.  No dyspnea.    Past Medical History:  Diagnosis Date  . Diabetes 1.5, managed as type 2 (HCC)   . Diabetes mellitus without complication (HCC)   . Hypertension     There are no problems to display for this patient.   Past Surgical History:  Procedure Laterality Date  . ABDOMINAL HYSTERECTOMY    . CHOLECYSTECTOMY       OB History   No obstetric history on file.     Family History  Family history unknown: Yes    Social History   Tobacco Use  . Smoking status: Never Smoker  . Smokeless tobacco: Never Used  Vaping Use  . Vaping Use: Never used  Substance Use Topics  . Alcohol use: Never  . Drug use: Never    Home Medications Prior to Admission medications   Medication Sig Start Date End Date Taking? Authorizing Provider  aspirin EC 81 MG tablet Take 81 mg by mouth daily. Swallow whole.     [provider]  Cholecalciferol (VITAMIN D3) 25 MCG (1000 UT) CAPS Take by mouth. 1 daily    [provider]  Cholecalciferol 25 MCG (1000 UT) capsule cholecalciferol (vitamin D3) 25 mcg (1,000 unit) capsule  Take 1 capsule every day by oral route.    [provider]  empagliflozin (JARDIANCE) 25 MG TABS tablet Take by mouth. 1 twice a day Patient not taking: Reported on 02/09/2021    [provider]  famotidine (PEPCID) 20 MG tablet Take 1 tablet (20 mg total) by mouth daily. Patient not taking: Reported on 02/09/2021 09/23/20   Garlon Hatchet, PA-C  hydrOXYzine (ATARAX/VISTARIL) 25 MG tablet hydroxyzine HCl 25 mg tablet  Take 1 tablet twice a day by oral route as needed.    [provider]  ibuprofen (ADVIL) 200 MG tablet Take 200 mg by mouth every 6 (six) hours as needed.    [provider]  isosorbide dinitrate (ISORDIL) 40 MG tablet Take 40 mg by mouth. 1 daily Patient not taking: Reported on 02/09/2021    [provider]  isosorbide mononitrate (IMDUR) 30 MG 24 hr tablet Take 1 tablet (30 mg total) by mouth daily. 08/07/20   Julieanne Manson, MD  metFORMIN (GLUCOPHAGE-XR) 500 MG 24 hr tablet 1 tab by mouth twice daily with food. Patient not taking: Reported on 02/09/2021 08/07/20   Julieanne Manson,  MD  Multiple Vitamin (MULTIVITAMIN) tablet Take 1 tablet by mouth daily.    [provider]  ondansetron (ZOFRAN ODT) 4 MG disintegrating tablet Take 1 tablet (4 mg total) by mouth every 8 (eight) hours as needed for nausea or vomiting. 02/19/21   Mickie Bail, NP  OVER THE COUNTER MEDICATION daily. colitisil ( natural constipation tablet) Patient not taking: Reported on 02/09/2021    [provider]    Allergies    Aspirin and Penicillins  Review of Systems   Review of Systems  Constitutional:       Per HPI, otherwise negative  HENT:       Per HPI, otherwise negative  Respiratory:       Per HPI,  otherwise negative  Cardiovascular:       Per HPI, otherwise negative  Gastrointestinal: Negative for vomiting.  Endocrine:       Negative aside from HPI  Genitourinary:       Neg aside from HPI   Musculoskeletal:       Per HPI, otherwise negative  Skin: Negative.   Neurological: Negative for syncope.    Physical Exam Updated Vital Signs BP 134/65   Pulse 65   Temp 98.2 F (36.8 C) (Oral)   Resp 15   Ht 5\' 2"  (1.575 m)   Wt 59.9 kg   LMP  (LMP Unknown)   SpO2 93%   BMI 24.14 kg/m   Physical Exam Vitals and nursing note reviewed.  Constitutional:      General: She is not in acute distress.    Appearance: She is well-developed.  HENT:     Head: Normocephalic and atraumatic.  Eyes:     Conjunctiva/sclera: Conjunctivae normal.  Cardiovascular:     Rate and Rhythm: Normal rate and regular rhythm.  Pulmonary:     Effort: Pulmonary effort is normal. No respiratory distress.     Breath sounds: Normal breath sounds. No stridor.  Abdominal:     General: There is no distension.  Musculoskeletal:     Cervical back: Normal range of motion and neck supple.  Skin:    General: Skin is warm and dry.  Neurological:     Mental Status: She is alert and oriented to person, place, and time.     Cranial Nerves: No cranial nerve deficit.     Comments: No facial asymmetry, speech is clear, brief, appropriate.  Patient does have left grip strength deficiency compared to contralateral side, 4/5.  Otherwise neuro exam unremarkable.  Psychiatric:     Comments: Patient perseverates on left arm cold sensation, left chest cold sensation.      ED Results / Procedures / Treatments   Labs (all labs ordered are listed, but only abnormal results are displayed) Labs Reviewed  BASIC METABOLIC PANEL - Abnormal; Notable for the following components:      Result Value   Glucose, Bld 119 (*)    All other components within normal limits  HEMOGLOBIN A1C - Abnormal; Notable for the following  components:   Hgb A1c MFr Bld 6.6 (*)    All other components within normal limits  RESP PANEL BY RT-PCR (FLU A&B, COVID) ARPGX2  CBC  MAGNESIUM  TSH  HEPATIC FUNCTION PANEL  BRAIN NATRIURETIC PEPTIDE  TROPONIN I (HIGH SENSITIVITY)  TROPONIN I (HIGH SENSITIVITY)    EKG EKG Interpretation  Date/Time:  Monday March 30 2021 11:31:57 EDT Ventricular Rate:  78 PR Interval:  176 QRS Duration: 132 QT Interval:  422 QTC  Calculation: 481 R Axis:   83 Text Interpretation: Normal sinus rhythm Non-specific intra-ventricular conduction block Abnormal ECG Confirmed by Gerhard Munch [phone]) on 03/30/2021 1:43:01 PM   Radiology DG Chest 2 View  Result Date: 03/30/2021 CLINICAL DATA:  Chest pain Shoulder and arm pain for 3 days EXAM: CHEST - 2 VIEW COMPARISON:  None. FINDINGS: The heart size and mediastinal contours are within normal limits. Both lungs are clear. The visualized skeletal structures are unremarkable. IMPRESSION: No active cardiopulmonary disease. Electronically Signed   By: Acquanetta Belling M.D.   On: 03/30/2021 13:58    Procedures Procedures   Medications Ordered in ED Medications  metoprolol succinate (TOPROL-XL) 24 hr tablet 25 mg (25 mg Oral Given 03/30/21 1350)  nitroGLYCERIN (NITROSTAT) SL tablet 0.4 mg (has no administration in time range)    ED Course  I have reviewed the triage vital signs and the nursing notes.  Pertinent labs & imaging results that were available during my care of the patient were reviewed by me and considered in my medical decision making (see chart for details).   History obtained by the patient, her companion, and chart review of urgent care visit just prior to ED arrival.  With consideration of pain, patient was placed on continuous cardiac monitoring, had sinus tach, 70s, pulse oximetry 99%.  Update: I discussed the patient's case with our cardiology colleagues [facility]) at bedside.  They will follow as a consulting service.  3:27  PM Patient in no distress. I have discussed the patient's case with our neurology colleagues as well.  Adult female presents with 3 days of what she describes as a cold sensation in her left arm, and pain, in her chest.  Given some exertional component she was referred here from urgent care by our cardiology colleagues. Here patient's initial findings are generally reassuring, with normal troponin, nonischemic EKG, though with some interventricular conduction delay.  Given the patient's chest pain, possibly with exertion I discussed her case with her cardiology colleagues and will follow as a consulting team. Patient's physical exam is most notable for demonstration of left upper extremity weakness, though without facial asymmetry, speech deficits or her personal complaints of weakness. Patient's vital signs unremarkable, initial labs unremarkable, patient will require admission for both cardiology and neurology ongoing care, including consideration of catheterization by the former service.  In regards to her weakness, patient has symptoms for 3 days, has no other obvious neurologic deficits, and not a candidate for emergent interventions. CT scan, possible MRI pending on admission.    MDM Rules/Calculators/A&P MDM Number of Diagnoses or Management Options Atypical chest pain: new, needed workup Weakness: new, needed workup   Amount and/or Complexity of Data Reviewed Clinical lab tests: ordered and reviewed Tests in the radiology section of CPT: reviewed and ordered Tests in the medicine section of CPT: reviewed and ordered Decide to obtain previous medical records or to obtain history from someone other than the patient: yes Discuss the patient with other providers: yes Independent visualization of images, tracings, or specimens: yes  Risk of Complications, Morbidity, and/or Mortality Presenting problems: high Diagnostic procedures: high Management options: high  Critical  Care Total time providing critical care: < 30 minutes  Patient Progress Patient progress: stable  Final Clinical Impression(s) / ED Diagnoses Final diagnoses:  Atypical chest pain  Weakness     Gerhard Munch, MD 03/30/21 1530

## 2021-03-30 NOTE — ED Notes (Signed)
Attempted to call charge nurse to brief on transfer but did not receive an answer.

## 2021-03-31 ENCOUNTER — Observation Stay (HOSPITAL_COMMUNITY): Payer: Self-pay

## 2021-03-31 ENCOUNTER — Other Ambulatory Visit (HOSPITAL_COMMUNITY): Payer: Self-pay

## 2021-03-31 LAB — LIPID PANEL
Cholesterol: 281 mg/dL — ABNORMAL HIGH (ref 0–200)
HDL: 71 mg/dL (ref >40)
LDL Cholesterol: 197 mg/dL — ABNORMAL HIGH (ref 0–99)
Total CHOL/HDL Ratio: 4 RATIO
Triglycerides: 66 mg/dL (ref <150)
VLDL: 13 mg/dL (ref 0–40)

## 2021-03-31 LAB — GLUCOSE, CAPILLARY
Glucose-Capillary: 92 mg/dL (ref 70–99)
Glucose-Capillary: 103 mg/dL — ABNORMAL HIGH (ref 70–99)
Glucose-Capillary: 357 mg/dL — ABNORMAL HIGH (ref 70–99)

## 2021-03-31 LAB — HEMOGLOBIN A1C
Hgb A1c MFr Bld: 6.5 % — ABNORMAL HIGH (ref 4.8–5.6)
Mean Plasma Glucose: 139.85 mg/dL

## 2021-03-31 MED ORDER — ISOSORBIDE MONONITRATE ER 30 MG PO TB24
30.0000 mg | ORAL_TABLET | Freq: Every day | ORAL | 0 refills | Status: DC
  Filled 2021-03-31: qty 30, 30d supply, fill #0

## 2021-03-31 MED ORDER — REGADENOSON 0.4 MG/5ML IV SOLN
0.4000 mg | Freq: Once | INTRAVENOUS | Status: AC
  Filled 2021-03-31: qty 5

## 2021-03-31 MED ORDER — ATORVASTATIN CALCIUM 40 MG PO TABS
40.0000 mg | ORAL_TABLET | Freq: Every day | ORAL | 0 refills | Status: DC
Start: 2021-03-31 — End: 2021-03-31

## 2021-03-31 MED ORDER — LIDOCAINE 5 % EX PTCH
1.0000 | MEDICATED_PATCH | CUTANEOUS | 0 refills | Status: AC
  Filled 2021-03-31: qty 5, 5d supply, fill #0

## 2021-03-31 MED ORDER — METFORMIN HCL ER 500 MG PO TB24
500.0000 mg | ORAL_TABLET | Freq: Every day | ORAL | 0 refills | Status: DC
  Filled 2021-03-31: qty 90, 90d supply, fill #0

## 2021-03-31 MED ORDER — ATORVASTATIN CALCIUM 40 MG PO TABS
40.0000 mg | ORAL_TABLET | Freq: Every day | ORAL | Status: DC
  Administered 2021-03-31: 40 mg via ORAL
  Filled 2021-03-31: qty 1

## 2021-03-31 MED ORDER — ATORVASTATIN CALCIUM 40 MG PO TABS
40.0000 mg | ORAL_TABLET | Freq: Every day | ORAL | 0 refills | Status: DC
  Filled 2021-03-31: qty 90, 90d supply, fill #0

## 2021-03-31 MED ORDER — ASPIRIN 81 MG PO TBEC
81.0000 mg | DELAYED_RELEASE_TABLET | Freq: Every day | ORAL | 0 refills | Status: DC
  Filled 2021-03-31: qty 360, 360d supply, fill #0

## 2021-03-31 MED ORDER — METFORMIN HCL ER 500 MG PO TB24: 500.0000 mg | ORAL_TABLET | Freq: Every day | ORAL | 0 refills | Status: DC

## 2021-03-31 MED ORDER — ISOSORBIDE MONONITRATE ER 30 MG PO TB24
30.0000 mg | ORAL_TABLET | Freq: Every day | ORAL | 0 refills | Status: DC
Start: 2021-03-31 — End: 2021-03-31

## 2021-03-31 MED ORDER — ASPIRIN 81 MG PO TBEC: 81.0000 mg | DELAYED_RELEASE_TABLET | Freq: Every day | ORAL | 0 refills | Status: DC

## 2021-03-31 MED ORDER — LIDOCAINE 5 % EX PTCH: 1.0000 | MEDICATED_PATCH | CUTANEOUS | 0 refills | Status: DC

## 2021-03-31 MED ORDER — LORAZEPAM 1 MG PO TABS
1.0000 mg | ORAL_TABLET | Freq: Once | ORAL | Status: AC | PRN
  Administered 2021-03-31: 1 mg via ORAL
  Filled 2021-03-31: qty 1

## 2021-03-31 MED ORDER — REGADENOSON 0.4 MG/5ML IV SOLN
INTRAVENOUS | Status: AC
  Administered 2021-03-31: 0.4 mg via INTRAVENOUS
  Filled 2021-03-31: qty 5

## 2021-03-31 MED ORDER — TECHNETIUM TC 99M TETROFOSMIN IV KIT
10.3000 | PACK | Freq: Once | INTRAVENOUS | Status: AC | PRN
  Administered 2021-03-31: 10.3 via INTRAVENOUS

## 2021-03-31 MED ORDER — TECHNETIUM TC 99M TETROFOSMIN IV KIT
30.6000 | PACK | Freq: Once | INTRAVENOUS | Status: AC | PRN
  Administered 2021-03-31: 30.6 via INTRAVENOUS

## 2021-03-31 MED ORDER — FAMOTIDINE 20 MG PO TABS
20.0000 mg | ORAL_TABLET | Freq: Two times a day (BID) | ORAL | Status: DC
  Administered 2021-03-31 (×2): 20 mg via ORAL
  Filled 2021-03-31 (×2): qty 1

## 2021-03-31 MED ORDER — GADOBUTROL 1 MMOL/ML IV SOLN
6.0000 mL | Freq: Once | INTRAVENOUS | Status: AC | PRN
  Administered 2021-03-31: 6 mL via INTRAVENOUS

## 2021-03-31 NOTE — Progress Notes (Signed)
Discharge teaching complete. Meds, diet, activity, follow up appointments reviewed and all questions answered. Copy of instructions given to patient in spanish and online interpreter used during entire education session. Meds brought to bedside by [facility] pharmacy. The lidocaine patches were not brought to the bedside so I have left a message for the pharmacy to call the patient in the morning to instruct her on how to pick up these patches. Dr. Margo Aye aware. Patient is waiting for her sister to come pick her up at 8pm when she gets off work.

## 2021-03-31 NOTE — Progress Notes (Signed)
MRI brain an dC-spine negative. Patient previously seen as outpatient with similar complaints felt to be functional, and again with inconsistencies on exam consistent with embellishment per Dr. Selina Cooley. No further workup needed at this time. Please call with further questions or concerns.   Ritta Slot, MD [facility] [phone]  If 7pm- 7am, please page neurology on call as listed in AMION.

## 2021-03-31 NOTE — Discharge Instructions (Signed)
Dolor musculoesqueltico Musculoskeletal Pain "Dolor musculoesqueltico" hace referencia a los dolores y las [employer], las articulaciones, los msculos y los tejidos [employer] rodean. Este dolor puede ocurrir en cualquier parte del cuerpo. Puede durar un breve perodo (agudo) o prolongarse mucho tiempo (crnico). Es posible que se realicen un examen fsico, anlisis de laboratorio y estudios de diagnstico por imgenes para Veterinary surgeon causa del dolor musculoesqueltico. Siga estas instrucciones en su casa: Estilo de vida  Trate de controlar o reducir los niveles de Librarian, academic. El estrs aumenta la tensin muscular y Engineer, production musculoesqueltico. Es importante reconocer cuando est ansioso o estresado y aprender distintas formas de Paediatric nurse. Esto puede incluir: ? Yoga o meditacin. ? Terapia cognitiva o conductual. ? Acupuntura o terapia de masajes.  Podr seguir con todas las actividades, a menos que Banker generen ms [employer] Cuando el dolor [place], retome las actividades habituales de a poco. Aumente gradualmente la intensidad y la duracin de las actividades o del ejercicio que realice. Control del dolor, la rigidez y la hinchazn  El tratamiento puede incluir medicamentos para Chief Technology Officer y la inflamacin que se toman por boca o que se aplican sobre la piel. Use los medicamentos de venta libre y los recetados solamente como se lo haya indicado el mdico.  Si el dolor es intenso, el reposo en cama puede ser beneficioso. Acustese o sintese en cualquier posicin que sea cmoda, pero salga de la cama y camine al [employer].  Si se lo indican, aplique calor en la zona afectada con la frecuencia que le haya indicado el mdico. Use la fuente de calor que el mdico le recomiende, como una compresa de calor hmedo o una almohadilla trmica. ? Coloque una [employer] piel y la fuente de Airline pilot. ? Aplique calor durante 20 a . ? Retire la  fuente de calor si la piel se pone de color rojo brillante. Esto es especialmente importante si no puede sentir dolor, calor o fro. Puede correr un riesgo mayor de sufrir quemaduras.  Si se lo indican, aplique hielo sobre la zona dolorida. Para hacer esto: ? Ponga el hielo en una bolsa plstica. ? Coloque una [employer] piel y Copy. ? Aplique el hielo durante , 2 o 3veces por da. ? Retire el hielo si la piel se pone de color rojo brillante. Esto es [employer]. Si no puede sentir dolor, calor o fro, tiene un mayor riesgo de que se dae la zona.      Indicaciones generales  El mdico puede recomendarle que consulte a un fisioterapeuta. Esta persona puede ayudarlo a elaborar un programa de ejercicios seguro.  Si se lo indica el mdico, haga ejercicios de fisioterapia para mejorar el movimiento y la fuerza de la zona afectada.  Cumpla con todas las visitas de seguimiento. Esto es importante. Esto incluye las visitas al fisioterapeuta. Comunquese con un mdico si:  El Product/process development scientist.  Los medicamentos no Associate Professor.  No puede usar la parte del cuerpo que le duele, como un brazo, una pierna o el cuello.  Tiene dificultad para dormir.  Tiene dificultad para [employer] cotidianas. Solicite ayuda de inmediato si:  Tiene una nueva lesin o el dolor empeora o es diferente.  Tiene adormecimiento u hormigueo en la zona dolorida. Resumen  "Dolor musculoesqueltico" hace referencia a los dolores y las [employer], las articulaciones, los msculos y los tejidos [employer] rodean.  Este dolor puede ocurrir en cualquier parte del cuerpo.  El mdico puede recomendarle que consulte a un fisioterapeuta. Esta persona puede ayudarlo a elaborar un programa de ejercicios seguro. Haga ejercicios como se lo haya indicado el fisioterapeuta.  Disminuya los niveles de estrs. El estrs puede Administrator, arts musculoesqueltico. [place] los mtodos para  disminuir el estrs se pueden mencionar la meditacin, el yoga, la terapia cognitiva o conductual, la acupuntura y la terapia de [place]. Esta informacin no tiene Theme park manager el consejo del mdico. Asegrese de hacerle al mdico cualquier pregunta que tenga. Document Revised: 06/05/2020 Document Reviewed: 06/05/2020 Elsevier Patient Education  2021 [employer].

## 2021-03-31 NOTE — Evaluation (Addendum)
Occupational Therapy Evaluation Patient Details Name: Tanya Mason MRN: [id number] DOB: 1961/03/11 Today's Date: 03/31/2021    History of Present Illness Tanya Mason is a 60 y.o. female admitted, 4/11 wtih c/o chest pain, left sided arm weakness. PMH  significant of HTN, IIDM, anxiety/depression, panic attack.   Clinical Impression   PTA, pt was living with her family on the first floor of a two story home, pt reports she was independent with ADL/IADL and functional mobility. Pt reports she is functioning close to baseline, requiring supervision to minguard assistance for functional mobility. Pt demonstrates minimal foot clearance and preference for reaching out to stabilize on furniture. Pt demonstrates ability to complete ADL at supervision level. She reports her family is available 24/7. She demonstrates equal strength bilaterally. Pt reporting limitations inconsistent with presentation (I.e. reports tremors but therapist did not note any tremors during session, pt reporting "severe headache" but rated pain 5/10). Pt then reporting she feels she is functioning close to baseline. Based on pt's current level of functioning and available support from family at d/c, no follow-up OT recommended at this time. OT will sign off. Thank you for referral.    Spanish interpreter utilized throughout session   Thayer Ohm #[id number]  Follow Up Recommendations  No OT follow up;Supervision - Intermittent    Equipment Recommendations  None recommended by OT    Recommendations for Other Services       Precautions / Restrictions Precautions Precautions:  (low fall) Restrictions Weight Bearing Restrictions: No      Mobility Bed Mobility Overal bed mobility: Modified Independent             General bed mobility comments: use of bed rails    Transfers Overall transfer level: Needs assistance Equipment used: None Transfers: Sit to/from Stand Sit to Stand:  Supervision         General transfer comment: supervision for safety, pt reported dizziness with standing    Balance Overall balance assessment: Mild deficits observed, not formally tested                                         ADL either performed or assessed with clinical judgement   ADL Overall ADL's : At baseline                                       General ADL Comments: pt reports she is at her baseline, pt requiring supervision for completion of ADL. x1 during functional mobility pt required min guard assistance, 1x minor instability pt reaching out for furniture to stabilize     Vision Baseline Vision/History: Wears glasses Wears Glasses: At all times Patient Visual Report: No change from baseline       Perception     Praxis      Pertinent Vitals/Pain Pain Assessment: 0-10 Pain Score: 5  Pain Location: headache Pain Descriptors / Indicators: Headache Pain Intervention(s): Monitored during session     Hand Dominance Right   Extremity/Trunk Assessment Upper Extremity Assessment Upper Extremity Assessment: Overall WFL for tasks assessed (4/5 BUE)   Lower Extremity Assessment Lower Extremity Assessment: Defer to PT evaluation;Overall WFL for tasks assessed       Communication Communication Communication: Prefers language other than English   Cognition Arousal/Alertness: Awake/alert Behavior During Therapy: [facility] for  tasks assessed/performed Overall Cognitive Status: Within Functional Limits for tasks assessed                                 General Comments: for basic mobility, pt reports she is having "tremors" but all extremities and body stable, no tremors noted during session. pt reporting "severe headache" reports pain level 5/10.   General Comments  HR briefly 117bpm, quickly returned to 98bpm. BP 104/68bpm sitting EOB, 95/70bpm with standing.    Exercises     Shoulder Instructions      Home  Living Family/patient expects to be discharged to:: Private residence Living Arrangements: Other (Comment) (family) Available Help at Discharge: Family;Available 24 hours/day Type of Home: House Home Access: Level entry     Home Layout: Two level;Able to live on main level with bedroom/bathroom Alternate Level Stairs-Number of Steps: flight   Bathroom Shower/Tub: Chief Strategy Officer: Standard     Home Equipment: None          Prior Functioning/Environment Level of Independence: Independent        Comments: pt reports she was ambulating without a device but reports she was nervous and felt unsafe most of the time, no falls, pt reports she was independent with ADL/IADL and functional mobility.        OT Problem List: Impaired balance (sitting and/or standing)      OT Treatment/Interventions:      OT Goals(Current goals can be found in the care plan section) Acute Rehab OT Goals Patient Stated Goal: to go home OT Goal Formulation: With patient Time For Goal Achievement: 04/14/21 Potential to Achieve Goals: Good  OT Frequency:     Barriers to D/C:            Co-evaluation              AM-PAC OT "6 Clicks" Daily Activity     Outcome Measure Help from another person eating meals?: None Help from another person taking care of personal grooming?: A Little Help from another person toileting, which includes using toliet, bedpan, or urinal?: A Little Help from another person bathing (including washing, rinsing, drying)?: A Little Help from another person to put on and taking off regular upper body clothing?: None Help from another person to put on and taking off regular lower body clothing?: None 6 Click Score: 21   End of Session Equipment Utilized During Treatment: Gait belt Nurse Communication: Mobility status  Activity Tolerance: Patient tolerated treatment well Patient left: in bed;with call bell/phone within reach;with bed alarm set  OT  Visit Diagnosis: Other abnormalities of gait and mobility (R26.89);Pain Pain - part of body:  (headache)                Time: 9326-7124 OT Time Calculation (min): 24 min Charges:  OT General Charges $OT Visit: 1 Visit OT Evaluation $OT Eval Low Complexity: 1 Low OT Treatments $Self Care/Home Management : 8-22 mins  Rosey Bath OTR/L Acute Rehabilitation Services Office: [phone]   Rebeca Alert 03/31/2021, 11:14 AM

## 2021-03-31 NOTE — Progress Notes (Addendum)
PT Cancellation Note  Patient Details Name: Tanya Mason MRN: [id number] DOB: March 28, 1961   Cancelled Treatment:    Reason Eval/Treat Not Completed: Patient at procedure or test/unavailable (stress test). Per conversation with OT, pt with no acute PT needs or follow-up rehab needs identified. Pt functioning near baseline mobility, will have 24/7 assist from family (see OT Evaluation note 03/31/21). Noted plans for d/c home today. Please reconsult if new needs arise.  Ina Homes, PT, DPT Acute Rehabilitation Services  Pager [phone] Office [phone]  Malachy Chamber 03/31/2021, 3:32 PM

## 2021-03-31 NOTE — Discharge Summary (Addendum)
Discharge Summary  Tanya Mason ZOX:[id number] DOB: 1961-04-11  PCP: Pcp, No  Admit date: 03/30/2021 Discharge date: 03/31/2021  Time spent: 35 minutes  Recommendations for Outpatient Follow-up:  1. Follow-up with your primary care provider within a week. 2. Follow-up with cardiology in 1 to 2 weeks. 3. Take your medications as prescribed.  Discharge Diagnoses:  Active Hospital Problems   Diagnosis Date Noted  . Atypical chest pain 03/30/2021    Resolved Hospital Problems  No resolved problems to display.    Discharge Condition: Stable.  Diet recommendation: Resume previous diet.  Vitals:   03/31/21 1409 03/31/21 1609  BP: 101/64 (!) 96/57  Pulse: (!) 104 93  Resp:  18  Temp:  98.4 F (36.9 C)  SpO2:  97%    History of present illness:  Tanya Mason is a 60 y.o. female with medical history significant of HTN, IIDM, anxiety/depression, panic attack, scented with multiple complaints.  Patient complaint of new onset of on of chest pain for last 4 days.  First episode started on Friday, Monday was advised, on the left aspect of the chest, was nonradiating, associated with feeling of nausea, and palpitations.  Episode last about few hours and subsided by itself.  Then, she developed more frequent episodes almost every day, she said it does not seem there is a correlation of onset of chest pain with any activity, denied any pain at night.  Yesterday, chest pain came back again radiated to bilateral shoulders neck and "left arm feels cold". This morning, woke up with same chest pain and has been persistent.  Lately, over last 4 months she has been experiencing increasing fatigue, muscle ache including bilateral shoulders, bilateral thighs, and occasional back sided headache, dull like.  Denied any blurry vision or double vision.  She went to ED several times discharged home with reassurance.  And she went to PCP in January and February, was  diagnosed with panic attack and was started on hydroxyzine as needed as well as intention of starting SSRI however patient declined due to concern about suicidal risk.  Patient denied any suicidal ideation today.  Daughter reported patient started to have chest pain since Friday.  Always happens at daytime, usually started with arm pain.  MRI brain and C-spine negative for any acute findings.  No further neurological work-up at this time.  Seen by cardiology, can be discharged from cardiac standpoint.  03/31/21: Patient was seen and examined at her bedside.  Appears to have musculoskeletal related pain affecting mainly her left lower chest tender to palpation.  Vital signs and labs reviewed and are stable.  Patient has been able to ambulate without any evidence of dyspnea and without assistance.  Hospital Course:  Active Problems:   Atypical chest pain  Atypical chest pain -ACS ruled out -Stress test tomorrow as per cardiology -Does have features of costochondritis given the severe tenderness on palpation. Will give Lidocaine patch. -Continue ASA and Lipitor. -Follow-up with cardiology in 1 to 2 weeks.  Hyperlipidemia Fasting LDL 197 on 04/08/2021. Started on Lipitor 40 mg daily. Follow-up with your primary care provider within a week.  Left arm pain and weakness -D/W Neuro attending at bedside -ASA, check lipid panel -Can not fully ruled out conversion disorder/somatization, recommend outpatient psychiatry evaluation. MRI brain negative for any acute intracranial findings.  Generalized weakness, fatigue and muscle aching -PCP's impression was probably due to anxiety depression TSH within normal limits today. Sed rate was normal at 16 on 03/30/2021. -  Psychiatry/psychology reason cannot be ruled out, recommend outpatient psychiatry follow-up to rule out conversion/somatization disorder.  Shoulder and neck pain -MRI cervical spine with and without contrast revealed: 1. Normal for  age MRI appearance of the cervical spine. Normal cervical spinal cord. 2. 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 -Outpatient PT evaluation.  Hx of Panic attack -Resume home hydroxyzine as it was prescribed.  IIDM Hemoglobin A1c 6.5 on 04/08/2021. Resume home Metformin. Follow with your PCP  Anxiety depression -She declined SSRI offer from PCP recently (Office record on Care Everywhere)   Code Status: Full code  Consults called: Cardiology and Neurology   Discharge Exam: BP (!) 96/57 (BP Location: Left Arm)   Pulse 93   Temp 98.4 F (36.9 C) (Oral)   Resp 18   Ht 5\' 2"  (1.575 m)   Wt 58.3 kg   LMP  (LMP Unknown)   SpO2 97%   BMI 23.50 kg/m  . General: 60 y.o. year-old female well developed well nourished in no acute distress.  Alert and oriented x3. . Cardiovascular: Regular rate and rhythm with no rubs or gallops.  No thyromegaly or JVD noted.   68 Respiratory: Clear to auscultation with no wheezes or rales. Good inspiratory effort. . Abdomen: Soft nontender nondistended with normal bowel sounds x4 quadrants. . Musculoskeletal: No lower extremity edema. 2/4 pulses in all 4 extremities. . Skin: No ulcerative lesions noted or rashes, . Psychiatry: Mood is appropriate for condition and setting  Discharge Instructions You were cared for by a hospitalist during your hospital stay. If you have any questions about your discharge medications or the care you received while you were in the hospital after you are discharged, you can call the unit and asked to speak with the hospitalist on call if the hospitalist that took care of you is not available. Once you are discharged, your primary care physician will handle any further medical issues. Please note that NO REFILLS for any discharge medications will be authorized once you are discharged, as it is imperative that you return to your  primary care physician (or establish a relationship with a primary care physician if you do not have one) for your aftercare needs so that they can reassess your need for medications and monitor your lab values.   Allergies as of 03/31/2021      Reactions   Aspirin Other (See Comments)   Penicillins Itching, Rash      Medication List    STOP taking these medications   famotidine 20 MG tablet Commonly known as: Pepcid   NON FORMULARY   ondansetron 4 MG disintegrating tablet Commonly known as: Zofran ODT     TAKE these medications   Aspirin Low Dose 81 MG EC tablet Generic drug: aspirin Take 1 tablet (81 mg total) by mouth daily. Swallow whole.   atorvastatin 40 MG tablet Commonly known as: LIPITOR Take 1 tablet (40 mg total) by mouth daily.   isosorbide mononitrate 30 MG 24 hr tablet Commonly known as: IMDUR Take 1 tablet (30 mg total) by mouth daily.   lidocaine 5 % Commonly known as: LIDODERM Place 1 patch onto the skin daily for 5 days. Remove & Discard patch within 12 hours or as directed by MD   metFORMIN 500 MG 24 hr tablet Commonly known as: GLUCOPHAGE-XR Take 1 tablet (500 mg total) by mouth daily with breakfast. What changed:   how much  to take  how to take this  when to take this  additional instructions   multivitamin tablet Take 1 tablet by mouth daily.      Allergies  Allergen Reactions  . Aspirin Other (See Comments)  . Penicillins Itching and Rash    Follow-up Information    [facility]. Call in 1 day(s).   Why: Please call for a post hospital follow-up appointment. Contact information: [address] [place] [address] [place] [address] [phone]       Elder Negus, MD. Call in 1 day(s).   Specialties: Cardiology, Radiology Why: Please call for a post hospital follow-up appointment. Contact information: [address] [place] [address] [phone]                 The results of significant diagnostics from this hospitalization (including imaging, microbiology, ancillary and laboratory) are listed below for reference.    Significant Diagnostic Studies: DG Chest 2 View  Result Date: 03/30/2021 CLINICAL DATA:  Chest pain Shoulder and arm pain for 3 days EXAM: CHEST - 2 VIEW COMPARISON:  None. FINDINGS: The heart size and mediastinal contours are within normal limits. Both lungs are clear. The visualized skeletal structures are unremarkable. IMPRESSION: No active cardiopulmonary disease. Electronically Signed   By: Acquanetta Belling M.D.   On: 03/30/2021 13:58   DG Cervical Spine 2 or 3 views  Result Date: 03/30/2021 CLINICAL DATA:  Bilateral anterior neck pain for 3 days, headaches EXAM: CERVICAL SPINE - 2-3 VIEW COMPARISON:  None. FINDINGS: Frontal and lateral views of the cervical spine are obtained. Alignment is anatomic. There are no acute fractures. Disc spaces are well preserved. Soft tissues are normal. Lung apices are clear. IMPRESSION: 1. Unremarkable cervical spine. Electronically Signed   By: Sharlet Salina M.D.   On: 03/30/2021 17:57   CT Head Wo Contrast  Result Date: 03/30/2021 CLINICAL DATA:  Neuro deficit, acute, stroke suspected L grip weakness EXAM: CT HEAD WITHOUT CONTRAST TECHNIQUE: Contiguous axial images were obtained from the base of the skull through the vertex without intravenous contrast. COMPARISON:  None. FINDINGS: Brain: No intracranial hemorrhage, mass effect, or midline shift. Few faint cortical calcifications are noted in the right occipital lobe. No hydrocephalus. The basilar cisterns are patent. No evidence of territorial infarct or acute ischemia. No extra-axial or intracranial fluid collection. Vascular: No hyperdense vessel.  No skull base atherosclerosis. Skull: Normal. Negative for fracture or focal lesion. Sinuses/Orbits: Defect of the medial left lamina papyracea may be congenital or sequela of remote orbital  fracture. No acute findings. Other: None. IMPRESSION: 1. No acute intracranial abnormality. 2. Few faint cortical calcifications in the right occipital lobe are nonspecific, may be sequela of prior infection or inflammation. Electronically Signed   By: Narda Rutherford M.D.   On: 03/30/2021 18:14   MR BRAIN W WO CONTRAST  Result Date: 03/31/2021 CLINICAL DATA:  60 year old female with left-side grip weakness, left arm discomfort, chest pain. EXAM: MRI HEAD WITHOUT AND WITH CONTRAST TECHNIQUE: Multiplanar, multiecho pulse sequences of the brain and surrounding structures were obtained without and with intravenous contrast. CONTRAST:  59mL GADAVIST GADOBUTROL 1 MMOL/ML IV SOLN COMPARISON:  Head CT 03/30/2021. FINDINGS: Brain: Normal cerebral volume for age. No restricted diffusion to suggest acute infarction. No midline shift, mass effect, evidence of mass lesion, ventriculomegaly, extra-axial collection or acute intracranial hemorrhage. Cervicomedullary junction and pituitary are within normal limits. Minimal for age nonspecific cerebral white matter T2 and FLAIR  hyperintensity. No cortical encephalomalacia or chronic cerebral blood products. Deep gray matter nuclei, brainstem and cerebellum are normal for age. No abnormal enhancement of the brain. No dural thickening identified. Vascular: Major intracranial vascular flow voids are preserved. The distal left vertebral artery appears dominant. The major dural venous sinuses are enhancing and appear to be patent. Skull and upper cervical spine: Nonspecific 12-13 mm solitary left frontal bone lesion with decreased T1 signal, enhancement, and conspicuous diffusion signal (series 5, image 87, series 9, image 14. Associated T2 and FLAIR hyperintensity. But visible bone marrow signal elsewhere is normal. On CT this lesion has a circumscribed lucent appearance. Cervical spine detailed separately today. Sinuses/Orbits: Negative. Other: Mastoids are clear. Visible internal  auditory structures appear normal. Visible scalp and face soft tissues appear negative. IMPRESSION: 1. No acute intracranial abnormality and largely unremarkable for age MRI appearance of the brain. 2. Solitary 12 mm indeterminate left frontal bone lesion. This could be a benign fibrous lesion. Recommend a repeat noncontrast head CT or noncontrast MRI to document stability in 3 is 6 months. 3. Cervical spine MRI reported separately today. Electronically Signed   By: Odessa Fleming M.D.   On: 03/31/2021 05:25   MR CERVICAL SPINE W WO CONTRAST  Result Date: 03/31/2021 CLINICAL DATA:  60 year old female with left-side grip weakness, left arm discomfort, chest pain. EXAM: MRI CERVICAL SPINE WITHOUT AND WITH CONTRAST TECHNIQUE: Multiplanar and multiecho pulse sequences of the cervical spine, to include the craniocervical junction and cervicothoracic junction, were obtained without and with intravenous contrast. CONTRAST:  6mL GADAVIST GADOBUTROL 1 MMOL/ML IV SOLN in conjunction with contrast enhanced imaging of the brain reported separately. COMPARISON:  Brain MRI today reported separately. Head CT and cervical spine radiographs yesterday. FINDINGS: Alignment: Normal cervical lordosis.  No spondylolisthesis. Vertebrae: Background bone marrow signal is within normal limits. There is a subtle left T1 pedicle heterogeneous marrow lesion with STIR hyperintensity but all also So intrinsic increased T1 signal compatible with benign hemangioma (series 12, image 33 and series 7, image 12). And there is a similar but smaller left C7 pedicle and transverse process lesion best seen on series 8, image 28 also in keeping with benign hemangioma. No convincing marrow edema or acute osseous abnormality. No similar marrow signal abnormality to that identified in the left frontal bone today. Cord: Normal. Normal cervical spinal canal. No abnormal intradural enhancement or dural thickening. Posterior Fossa, vertebral arteries, paraspinal  tissues: Cervicomedullary junction is within normal limits. Brain is detailed separately today. Preserved major vascular flow voids in the neck, the left vertebral artery is dominant and the right diminutive. Negative visible neck soft tissues and lung apices. Disc levels: C2-C3:  Negative. C3-C4:  Negative. C4-C5:  Negative. C5-C6:  Minimal disc bulging.  No stenosis. C6-C7:  Negative. C7-T1:  Negative. Negative visible upper thoracic levels. IMPRESSION: 1. Normal for age MRI appearance of the cervical spine. Normal cervical spinal cord. 2. 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 Electronically Signed   By: Odessa Fleming M.D.   On: 03/31/2021 05:32   NM Myocar Multi W/Spect W/Wall Motion / EF  Result Date: 03/31/2021 CLINICAL DATA:  Chest pain EXAM: MYOCARDIAL IMAGING WITH SPECT (REST AND PHARMACOLOGIC-STRESS) GATED LEFT VENTRICULAR WALL MOTION STUDY LEFT VENTRICULAR EJECTION FRACTION TECHNIQUE: Standard myocardial SPECT imaging was performed after resting intravenous injection of 10.3 mCi Tc-65m tetrofosmin. Subsequently, intravenous infusion of Lexiscan was performed under the supervision of  the Cardiology staff. At peak effect of the drug, 30.6 mCi Tc-41m tetrofosmin was injected intravenously and standard myocardial SPECT imaging was performed. Quantitative gated imaging was also performed to evaluate left ventricular wall motion, and estimate left ventricular ejection fraction. COMPARISON:  None. FINDINGS: Perfusion: Reduced activity in the apex on stress and rest images favors apical thinning over scarring. No inducible ischemia. Wall Motion: Normal left ventricular wall motion. No left ventricular dilation. Left Ventricular Ejection Fraction: 63 % End diastolic volume 47 ml End systolic volume 17 ml IMPRESSION: 1. No inducible ischemia. Reduced activity in the apex on stress and rest images favors apical thinning over  scarring. 2. Normal left ventricular wall motion. 3. Left ventricular ejection fraction 63% 4. Non invasive risk stratification*: Low *2012 Appropriate Use Criteria for Coronary Revascularization Focused Update: J Am Coll Cardiol. 2012;59(9):857-881. http://content.dementiazones.com.aspx?articleid=1201161 Electronically Signed   By: Gaylyn Rong M.D.   On: 03/31/2021 16:50    Microbiology: Recent Results (from the past 240 hour(s))  Resp Panel by RT-PCR (Flu A&B, Covid) Nasopharyngeal Swab     Status: None   Collection Time: 03/30/21 12:43 PM   Specimen: Nasopharyngeal Swab; Nasopharyngeal(NP) swabs in vial transport medium  Result Value Ref Range Status   SARS Coronavirus 2 by RT PCR NEGATIVE NEGATIVE Final    Comment: (NOTE) SARS-CoV-2 target nucleic acids are NOT DETECTED.  The SARS-CoV-2 RNA is generally detectable in upper respiratory specimens during the acute phase of infection. The lowest concentration of SARS-CoV-2 viral copies this assay can detect is 138 copies/mL. A negative result does not preclude SARS-Cov-2 infection and should not be used as the sole basis for treatment or other patient management decisions. A negative result may occur with  improper specimen collection/handling, submission of specimen other than nasopharyngeal swab, presence of viral mutation(s) within the areas targeted by this assay, and inadequate number of viral copies(<138 copies/mL). A negative result must be combined with clinical observations, patient history, and epidemiological information. The expected result is Negative.  Fact Sheet for Patients:  BloggerCourse.com  Fact Sheet for Healthcare Providers:  SeriousBroker.it  This test is no t yet approved or cleared by the [place] FDA and  has been authorized for detection and/or diagnosis of SARS-CoV-2 by FDA under an Emergency Use Authorization (EUA). This EUA will remain   in effect (meaning this test can be used) for the duration of the COVID-19 declaration under Section 564(b)(1) of the Act, 21 U.S.C.section 360bbb-3(b)(1), unless the authorization is terminated  or revoked sooner.       Influenza A by PCR NEGATIVE NEGATIVE Final   Influenza B by PCR NEGATIVE NEGATIVE Final    Comment: (NOTE) The Xpert Xpress SARS-CoV-2/FLU/RSV plus assay is intended as an aid in the diagnosis of influenza from Nasopharyngeal swab specimens and should not be used as a sole basis for treatment. Nasal washings and aspirates are unacceptable for Xpert Xpress SARS-CoV-2/FLU/RSV testing.  Fact Sheet for Patients: BloggerCourse.com  Fact Sheet for Healthcare Providers: SeriousBroker.it  This test is not yet approved or cleared by the [place] FDA and has been authorized for detection and/or diagnosis of SARS-CoV-2 by FDA under an Emergency Use Authorization (EUA). This EUA will remain in effect (meaning this test can be used) for the duration of the COVID-19 declaration under Section 564(b)(1) of the Act, 21 U.S.C. section 360bbb-3(b)(1), unless the authorization is terminated or revoked.  Performed at [facility], [address], [place], [place] [address]      Labs:  Basic Metabolic Panel: Recent Labs  Lab 03/30/21 1136 03/30/21 1342  NA 138  --   K 3.8  --   CL 102  --   CO2 27  --   GLUCOSE 119*  --   BUN 10  --   CREATININE 0.65  --   CALCIUM 9.3  --   MG  --  2.1   Liver Function Tests: Recent Labs  Lab 03/30/21 1342  AST 21  ALT 19  ALKPHOS 53  BILITOT 0.6  PROT 7.5  ALBUMIN 4.0   No results for input(s): LIPASE, AMYLASE in the last 168 hours. No results for input(s): AMMONIA in the last 168 hours. CBC: Recent Labs  Lab 03/30/21 1136  WBC 6.5  HGB 13.6  HCT 42.7  MCV 86.4  PLT 337   Cardiac Enzymes: Recent Labs  Lab 03/30/21 1708  CKTOTAL 66   BNP: BNP (last  3 results) Recent Labs    03/30/21 1349  BNP 19.2    ProBNP (last 3 results) No results for input(s): PROBNP in the last 8760 hours.  CBG: Recent Labs  Lab 03/30/21 1007 03/30/21 2151 03/31/21 0754 03/31/21 1125 03/31/21 1648  GLUCAP 134* 147* 92 103* 357*       Signed:  Darlin Drop, MD [facility] 03/31/2021, 5:06 PM

## 2021-03-31 NOTE — Progress Notes (Signed)
Patient off floor for nuc med stress test.

## 2021-04-16 ENCOUNTER — Encounter (HOSPITAL_COMMUNITY): Payer: Self-pay | Admitting: Emergency Medicine

## 2021-04-16 ENCOUNTER — Emergency Department (HOSPITAL_COMMUNITY)
Admission: EM | Admit: 2021-04-16 | Discharge: 2021-04-16 | Disposition: A | Discharge status: Home / Self Care | Payer: Medicaid Other | Attending: Emergency Medicine | Admitting: Emergency Medicine

## 2021-04-16 ENCOUNTER — Emergency Department (HOSPITAL_COMMUNITY): Payer: Medicaid Other

## 2021-04-16 ENCOUNTER — Other Ambulatory Visit: Payer: Self-pay

## 2021-04-16 DIAGNOSIS — R519 Headache, unspecified: Secondary | ICD-10-CM | POA: Insufficient documentation

## 2021-04-16 DIAGNOSIS — R531 Weakness: Secondary | ICD-10-CM | POA: Insufficient documentation

## 2021-04-16 DIAGNOSIS — Z20822 Contact with and (suspected) exposure to covid-19: Secondary | ICD-10-CM | POA: Insufficient documentation

## 2021-04-16 DIAGNOSIS — E1165 Type 2 diabetes mellitus with hyperglycemia: Secondary | ICD-10-CM | POA: Insufficient documentation

## 2021-04-16 DIAGNOSIS — Z7982 Long term (current) use of aspirin: Secondary | ICD-10-CM | POA: Insufficient documentation

## 2021-04-16 DIAGNOSIS — J029 Acute pharyngitis, unspecified: Secondary | ICD-10-CM | POA: Insufficient documentation

## 2021-04-16 DIAGNOSIS — Z7984 Long term (current) use of oral hypoglycemic drugs: Secondary | ICD-10-CM | POA: Insufficient documentation

## 2021-04-16 DIAGNOSIS — R059 Cough, unspecified: Secondary | ICD-10-CM

## 2021-04-16 DIAGNOSIS — G8929 Other chronic pain: Secondary | ICD-10-CM

## 2021-04-16 DIAGNOSIS — M546 Pain in thoracic spine: Secondary | ICD-10-CM | POA: Insufficient documentation

## 2021-04-16 LAB — BASIC METABOLIC PANEL
Anion gap: 9 (ref 5–15)
BUN: 6 mg/dL — ABNORMAL LOW (ref 8–23)
CO2: 27 mmol/L (ref 22–32)
Calcium: 9 mg/dL (ref 8.9–10.3)
Chloride: 101 mmol/L (ref 98–111)
Creatinine, Ser: 0.68 mg/dL (ref 0.44–1.00)
GFR, Estimated: >60 mL/min (ref >60)
Glucose, Bld: 148 mg/dL — ABNORMAL HIGH (ref 70–99)
Potassium: 4.2 mmol/L (ref 3.5–5.1)
Sodium: 137 mmol/L (ref 135–145)

## 2021-04-16 LAB — CBC
HCT: 39.4 % (ref 36.0–46.0)
Hemoglobin: 12.5 g/dL (ref 12.0–15.0)
MCH: 27.2 pg (ref 26.0–34.0)
MCHC: 31.7 g/dL (ref 30.0–36.0)
MCV: 85.8 fL (ref 80.0–100.0)
Platelets: 321 10*3/uL (ref 150–400)
RBC: 4.59 MIL/uL (ref 3.87–5.11)
RDW: 12.7 % (ref 11.5–15.5)
WBC: 6.1 10*3/uL (ref 4.0–10.5)
nRBC: 0 % (ref 0.0–0.2)

## 2021-04-16 LAB — URINALYSIS, ROUTINE W REFLEX MICROSCOPIC
Bacteria, UA: NONE SEEN
Bilirubin Urine: NEGATIVE
Glucose, UA: NEGATIVE mg/dL
Ketones, ur: NEGATIVE mg/dL
Leukocytes,Ua: NEGATIVE
Nitrite: NEGATIVE
Protein, ur: NEGATIVE mg/dL
Specific Gravity, Urine: 1.005 (ref 1.005–1.030)
pH: 7 (ref 5.0–8.0)

## 2021-04-16 LAB — RESP PANEL BY RT-PCR (FLU A&B, COVID) ARPGX2
Influenza A by PCR: NEGATIVE
Influenza B by PCR: NEGATIVE
SARS Coronavirus 2 by RT PCR: NEGATIVE

## 2021-04-16 MED ORDER — KETOROLAC TROMETHAMINE 15 MG/ML IJ SOLN
15.0000 mg | Freq: Once | INTRAMUSCULAR | Status: AC
  Administered 2021-04-16: 15 mg via INTRAVENOUS
  Filled 2021-04-16: qty 1

## 2021-04-16 MED ORDER — BACLOFEN 10 MG PO TABS: 10.0000 mg | ORAL_TABLET | Freq: Once | ORAL | Status: DC

## 2021-04-16 MED ORDER — DIPHENHYDRAMINE HCL 50 MG/ML IJ SOLN
12.5000 mg | Freq: Once | INTRAMUSCULAR | Status: AC
  Administered 2021-04-16: 12.5 mg via INTRAVENOUS
  Filled 2021-04-16: qty 1

## 2021-04-16 MED ORDER — MORPHINE SULFATE (PF) 4 MG/ML IV SOLN
4.0000 mg | Freq: Once | INTRAVENOUS | Status: DC
Start: 2021-04-16 — End: 2021-04-17
  Filled 2021-04-16: qty 1

## 2021-04-16 MED ORDER — PROCHLORPERAZINE EDISYLATE 10 MG/2ML IJ SOLN
5.0000 mg | Freq: Once | INTRAMUSCULAR | Status: AC
  Administered 2021-04-16: 5 mg via INTRAVENOUS
  Filled 2021-04-16: qty 2

## 2021-04-16 MED ORDER — SODIUM CHLORIDE 0.9 % IV BOLUS
1000.0000 mL | Freq: Once | INTRAVENOUS | Status: AC
  Administered 2021-04-16: 1000 mL via INTRAVENOUS

## 2021-04-16 NOTE — ED Notes (Signed)
Pt refusing to have the blood pressure take. Keeps crying and says its to tight when it blows up

## 2021-04-16 NOTE — Discharge Instructions (Addendum)
As we discussed, your blood work and urine all came back normal.  You do not have COVID.  We gave you medicine and fluids that helped your pain.  You have some tightness in your upper back that you can use heat on to help with pain in the future.  You should take all of your medications as prescribed and follow up with your PCP.  You should talk with your doctor about treatment for allergies.  If you have worsening in your symptoms, you should be seen by your doctor right away.  Come back to the Emergency Room if your headache is new or different or worse than before.

## 2021-04-16 NOTE — ED Provider Notes (Signed)
Emergency Medicine Provider Triage Evaluation Note  Raylene Carmickle, a 60 y.o. female evaluated in triage.  Multiple complaints. Pt complains of bilateral mouth numbness, low back pain, chronic b/l leg weakness. Sore throat, cough. States lips feel "sticky". Chronic headache. Translator used.   BP 120/73 (BP Location: Right Arm)   Pulse 95   Temp 98.2 F (36.8 C) (Oral)   Resp 18   LMP  (LMP Unknown)   SpO2 99%   Patient is alert, no acute distress, normal work of breathing. No facial asymmetry, normal sensation to all distributions of face. No oral swelling. spontaneously moving all extremities equally. Speech is fluent. Poor effort on strength exam, equal strength BUE and BLE, no unilateral pronator drift.     Medically screening exam initiated at 12:11 PM. Appropriate orders placed.  [name] was informed that the remainder of the evaluation will be completed by another provider, this initial triage assessment does not replace that evaluation, and the importance of remaining in the ED until their evaluation is complete.       Kimori Tartaglia, [place] N, PA-C 04/16/21 1222    Gerhard Munch, MD 04/24/21 [phone]

## 2021-04-16 NOTE — ED Triage Notes (Addendum)
Pt complains of numbness around her lips top and bottom since last night and sore throat that started yesterday. Denies SOB/ CP. Pt also states both legs feel weak for months along with having ongoing headaches for months. Pt also complains of lower back pain.

## 2021-04-16 NOTE — ED Notes (Signed)
Translator used for d/c, Follow up appts reviewed w/ pt. Denies questions or concerns @ this time. Aware workup WNL. Education on s/s of worsening and when to return. Tx for mailiase. states relief @ this time. Left w/ even and steady gait. NAD noted. PIV removed and VSS.

## 2021-04-16 NOTE — ED Provider Notes (Signed)
[facility] EMERGENCY DEPARTMENT Provider Note   CSN: [id number] Arrival date & time: 04/16/21  1205     History Chief Complaint  Patient presents with  . Back Pain  . Weakness    Tanya Mason is a 60 y.o. female.  Thoracic Back Pain Mostly into her left shoulder Doesn't radiate to her arms Has had this pain before No numbness/tingling/weakness in her BUE No midline pain in her neck No recent trauma  Headache No changes in vision Does have itching in her eyes Not a new headache, has had this type of headache previously, mostly when her sugars are high No focal weakness, numbness or tingling, head trauma, loss of consciousness Has taken tylenol and motrin which improves pain but doesn't completely go away  Elevated Blood Sugars Reports sugars have been "out of control" Highest to 250 since discharge from hospital Taking metformin 500mg  QD Reports mouth is dry and she is peeing a lot  Sore throat 4 days Subjective fevers at home, none documented No difficulty swallowing Has feeling of tightness in her mouth Also clear rhinorrhea         Past Medical History:  Diagnosis Date  . Diabetes 1.5, managed as type 2 (HCC)   . Diabetes mellitus without complication (HCC)   . Hypertension     Patient Active Problem List   Diagnosis Date Noted  . Atypical chest pain 03/30/2021    Past Surgical History:  Procedure Laterality Date  . ABDOMINAL HYSTERECTOMY    . CHOLECYSTECTOMY       OB History   No obstetric history on file.     Family History  Family history unknown: Yes    Social History   Tobacco Use  . Smoking status: Never Smoker  . Smokeless tobacco: Never Used  Vaping Use  . Vaping Use: Never used  Substance Use Topics  . Alcohol use: Never  . Drug use: Never    Home Medications Prior to Admission medications   Medication Sig Start Date End Date Taking? Authorizing Provider  aspirin 81 MG EC tablet  Take 1 tablet (81 mg total) by mouth daily. Swallow whole. 03/31/21 03/26/22 Yes Hall, Carole N, DO  atorvastatin (LIPITOR) 40 MG tablet Take 1 tablet (40 mg total) by mouth daily. 03/31/21 06/29/21 Yes 08/30/21, DO  isosorbide mononitrate (IMDUR) 30 MG 24 hr tablet Take 1 tablet (30 mg total) by mouth daily. 03/31/21 04/30/21 Yes 06/30/21, DO  metFORMIN (GLUCOPHAGE-XR) 500 MG 24 hr tablet Take 1 tablet (500 mg total) by mouth daily with breakfast. 03/31/21 06/29/21 Yes 08/30/21, DO  Multiple Vitamin (MULTIVITAMIN) tablet Take 1 tablet by mouth daily.   Yes [provider]    Allergies    Aspirin and Penicillins  Review of Systems   Review of Systems  Constitutional: Positive for fever (subjective). Negative for activity change and appetite change.  HENT: Positive for rhinorrhea and sore throat. Negative for congestion and trouble swallowing.   Eyes: Positive for itching. Negative for visual disturbance.  Respiratory: Positive for cough. Negative for shortness of breath.   Cardiovascular: Negative for chest pain and leg swelling.  Gastrointestinal: Negative for abdominal pain, diarrhea, nausea and vomiting.  Endocrine: Positive for polydipsia and polyuria.  Genitourinary: Positive for frequency. Negative for difficulty urinating and dysuria.  Musculoskeletal: Positive for back pain.  Skin: Negative for rash.  Neurological: Positive for headaches. Negative for dizziness, syncope, facial asymmetry, weakness and numbness.  Physical Exam Updated Vital Signs BP 133/69   Pulse 74   Temp 98.2 F (36.8 C) (Oral)   Resp 18   LMP  (LMP Unknown)   SpO2 100%   Physical Exam Constitutional:      Appearance: She is not ill-appearing or toxic-appearing.     Comments: Intermittently crying  HENT:     Head: Normocephalic and atraumatic.     Nose: Nose normal. No congestion or rhinorrhea.     Mouth/Throat:     Mouth: Mucous membranes are moist.     Pharynx: No  oropharyngeal exudate or posterior oropharyngeal erythema.  Eyes:     Extraocular Movements: Extraocular movements intact.     Conjunctiva/sclera: Conjunctivae normal.     Pupils: Pupils are equal, round, and reactive to light.  Cardiovascular:     Rate and Rhythm: Normal rate and regular rhythm.     Heart sounds: No murmur heard. No friction rub. No gallop.   Pulmonary:     Effort: Pulmonary effort is normal.     Breath sounds: Normal breath sounds. No wheezing, rhonchi or rales.  Abdominal:     General: Abdomen is flat.     Palpations: Abdomen is soft.     Tenderness: There is no abdominal tenderness.  Musculoskeletal:     Cervical back: Normal, normal range of motion and neck supple. No rigidity or tenderness.     Thoracic back: Normal.       Back:     Right lower leg: No edema.     Left lower leg: No edema.     Comments: No TTP along cervical or thoracic spinous processes, no step off noted  5/5 strength BUE/BLE  Lymphadenopathy:     Cervical: No cervical adenopathy.  Skin:    General: Skin is warm and dry.     Findings: No rash.  Neurological:     General: No focal deficit present.     Mental Status: She is alert and oriented to person, place, and time.     Cranial Nerves: No cranial nerve deficit.     Sensory: No sensory deficit.  Psychiatric:     Comments: Intermittently tearful during exam, very difficult to redirect during examination, somewhat tangential thought process     ED Results / Procedures / Treatments   Labs (all labs ordered are listed, but only abnormal results are displayed) Labs Reviewed  BASIC METABOLIC PANEL - Abnormal; Notable for the following components:      Result Value   Glucose, Bld 148 (*)    BUN 6 (*)    All other components within normal limits  URINALYSIS, ROUTINE W REFLEX MICROSCOPIC - Abnormal; Notable for the following components:   Color, Urine STRAW (*)    Hgb urine dipstick MODERATE (*)    All other components within  normal limits  RESP PANEL BY RT-PCR (FLU A&B, COVID) ARPGX2  CBC  CBG MONITORING, ED    EKG None  Radiology DG Chest 1 View  Result Date: 04/16/2021 CLINICAL DATA:  Numbness around her lips insert throat. EXAM: CHEST  1 VIEW COMPARISON:  None. FINDINGS: The heart size and mediastinal contours are within normal limits. Both lungs are clear. The visualized skeletal structures are unremarkable. IMPRESSION: No active disease. Electronically Signed   By: Romona Curls M.D.   On: 04/16/2021 13:27    Procedures Procedures   Medications Ordered in ED Medications  morphine 4 MG/ML injection 4 mg (has no administration in time range)  diphenhydrAMINE (  BENADRYL) injection 12.5 mg (12.5 mg Intravenous Given 04/16/21 1729)  ketorolac (TORADOL) 15 MG/ML injection 15 mg (15 mg Intravenous Given 04/16/21 1729)  prochlorperazine (COMPAZINE) injection 5 mg (5 mg Intravenous Given 04/16/21 1728)  sodium chloride 0.9 % bolus 1,000 mL (0 mLs Intravenous Stopped 04/16/21 1835)    ED Course  I have reviewed the triage vital signs and the nursing notes.  Pertinent labs & imaging results that were available during my care of the patient were reviewed by me and considered in my medical decision making (see chart for details).    MDM Rules/Calculators/A&P                           Patient is a 60 yo female who history of DM, Hypertension, with recent admission on 4/11 for atypical chest pain, who presents with complaints of headache, left-sided thoracic upper back pain, sore throat, and elevated blood sugars.  Interpreter used for entirety of Encounter, however patient is so difficult to obtain a history from.  It appears that this headache she is having is not new, it occurs mostly when she has elevated blood sugars.  Her highest blood sugar at home was 250.  We will obtain a BMP and UA to further assess.  Will also give 1L bolus.  She has had an MRI brain and cervical spine on 4/12 without acute findings,  headache has not changed since then, no neurologic imaging at this time.  We will get her headache cocktail of Benadryl, Toradol, Compazine.  Thoracic back pain is likely also related to this.  Does not seem to be new.  She has a very clear tender point on her left trapezius, no midline tenderness of cervical or thoracic spine.  Should improve with headache cocktail as well.  Given her history of sore throat, throat is normal on examination, however with COVID swab.  Patient's vital signs are stable, if blood work returns in normal limits, she will likely be discharged home with follow-up with PCP.  BMP, CBC, UA within normal limits.  Glucose 148.  Chest x-ray without acute findings.  EKG without acute changes.  COVID and Flu negative.  Patient seen again at bedside, she is again tearful and states that she has not had any improvement in her headache.  Discussed current results and recent imaging.  Will give morphine 4mg  x1 and recheck afterwards.  Patient seen again at bedside, noted improvement in her pain.  Discussed that sore throat could be related to allergies.  Can f/u with PCP in regards to this.  Vital signs remained stable, she is stable for discharge home.  Final Clinical Impression(s) / ED Diagnoses Final diagnoses:  Chronic nonintractable headache, unspecified headache type  Chronic left-sided thoracic back pain  Sore throat    Rx / DC Orders ED Discharge Orders    None       , DO 04/16/21 1919    04/18/21, MD 04/16/21 04/18/21

## 2021-04-30 ENCOUNTER — Encounter: Payer: Self-pay | Admitting: Cardiology

## 2021-04-30 ENCOUNTER — Ambulatory Visit: Payer: Medicaid Other | Admitting: Cardiology

## 2021-04-30 ENCOUNTER — Other Ambulatory Visit: Payer: Self-pay

## 2021-04-30 VITALS — BP 134/81 | HR 89 | Temp 98.0°F | Resp 16 | Ht 62.0 in | Wt 131.0 lb

## 2021-04-30 DIAGNOSIS — I447 Left bundle-branch block, unspecified: Secondary | ICD-10-CM

## 2021-04-30 DIAGNOSIS — E785 Hyperlipidemia, unspecified: Secondary | ICD-10-CM | POA: Insufficient documentation

## 2021-04-30 DIAGNOSIS — E782 Mixed hyperlipidemia: Secondary | ICD-10-CM

## 2021-04-30 NOTE — Progress Notes (Signed)
Follow up visit  Subjective:   Tanya Mason, female    DOB: 29-Sep-1961, 60 y.o.   MRN: [id number]  Spanish speaking patient. Medical assistant Joeseph Amor assisted with interpretation.    HPI  Chief Complaint  Patient presents with  . Hypertension    60 year old Hispanic female with type 2 diabetes mellitus, mex hyperlipidemia, anxiety  Patient was recently seen in [facility] urgent care with chest pain. ACS was ruled out. Stress test did not show ischemia. Given her paresthesias, she also had stroke workup, which was negative for acute or old stroke. Nonspecific frontal lobe lesion was found, was thought to be benign fibrous lesion. Lipid panel showed LDL of 197.  Patient continues to have a multitude of complaints, including neck pain, fatigue, arm pain. She continues on Imdur. She tells me that she was asked to take the medication 'for life" after her cholecystectomy surgery while in [place].     Current Outpatient Medications on File Prior to Visit  Medication Sig Dispense Refill  . aspirin 81 MG EC tablet Take 1 tablet (81 mg total) by mouth daily. Swallow whole. 360 tablet 0  . atorvastatin (LIPITOR) 40 MG tablet Take 1 tablet (40 mg total) by mouth daily. 90 tablet 0  . isosorbide mononitrate (IMDUR) 30 MG 24 hr tablet Take 1 tablet (30 mg total) by mouth daily. 30 tablet 0  . metFORMIN (GLUCOPHAGE-XR) 500 MG 24 hr tablet Take 1 tablet (500 mg total) by mouth daily with breakfast. 90 tablet 0  . Multiple Vitamin (MULTIVITAMIN) tablet Take 1 tablet by mouth daily.     No current facility-administered medications on file prior to visit.    Cardiovascular & other pertient studies:  EKG 04/30/2021:  Sinus rhythm 99 bpm Left bundle branch block  Nuclear stress test 03/31/2021: 1. No inducible ischemia. Reduced activity in the apex on stress and rest images favors apical thinning over scarring. 2. Normal left ventricular wall motion. 3. Left  ventricular ejection fraction 63% 4. Non invasive risk stratification*: Low  MRI brain 03/31/2021: 1. No acute intracranial abnormality and largely unremarkable for age MRI appearance of the brain. 2. Solitary 12 mm indeterminate left frontal bone lesion. This could be a benign fibrous lesion. Recommend a repeat noncontrast head CT or noncontrast MRI to document stability in 3 is 6 months. 3. Cervical spine MRI reported separately today.   Recent labs: 04/16/2021: Glucose 148, BUN/Cr 6/0.68. EGFR >60. Na/K 137/4.2.  H/H 12/35. MCV 81. Platelets 321 HbA1C 6.6% Chol 281, TG 66, HDL 71, LDL 197 TSH 0.6 normal    Review of Systems  Constitutional: Positive for malaise/fatigue.  Cardiovascular: Negative for chest pain, dyspnea on exertion, leg swelling, palpitations and syncope.  Musculoskeletal: Positive for neck pain.       Left arm pain         Vitals:   04/30/21 1037  BP: 134/81  Pulse: 89  Resp: 16  Temp: 98 F (36.7 C)  SpO2: 97%    Body mass index is 23.96 kg/m. Filed Weights   04/30/21 1037  Weight: 131 lb (59.4 kg)     Objective:   Physical Exam Vitals and nursing note reviewed.  Constitutional:      General: She is not in acute distress. Neck:     Vascular: No JVD.  Cardiovascular:     Rate and Rhythm: Normal rate and regular rhythm.     Heart sounds: Normal heart sounds. No murmur heard.   Pulmonary:  Effort: Pulmonary effort is normal.     Breath sounds: Normal breath sounds. No wheezing or rales.  Musculoskeletal:     Right lower leg: No edema.     Left lower leg: No edema.           Assessment & Recommendations:   60 year old Hispanic female with type 2 diabetes mellitus, mex hyperlipidemia, anxiety  Mixed hyperlipidemia: LDL 197. Continue Lipitor 40 mg. Check lipid panel. If LDL now down <100, increase Lipitor yo 80 mg.  Recent negative stress test. No ACS, stroke. No clear indication for Aspirin, thus discontinued.  I  also do not think she needs Imdur, but she is convinced that she needs to take it 'for life" as advised to her after gall bladder surgery in [place].  Several of patient's complaints appear functional in nature. No further cardiac workup necessary at this time. Recommend establishing care with a primary care provider.  F/u w/me as needed.       Nigel Mormon, MD Pager: [phone] Office: [phone]

## 2021-05-12 ENCOUNTER — Encounter (HOSPITAL_COMMUNITY): Payer: Self-pay

## 2021-05-12 ENCOUNTER — Emergency Department (HOSPITAL_COMMUNITY)
Admission: EM | Admit: 2021-05-12 | Discharge: 2021-05-12 | Disposition: A | Discharge status: Home / Self Care | Payer: Self-pay | Attending: Emergency Medicine | Admitting: Emergency Medicine

## 2021-05-12 ENCOUNTER — Emergency Department (HOSPITAL_COMMUNITY): Payer: Self-pay

## 2021-05-12 DIAGNOSIS — E119 Type 2 diabetes mellitus without complications: Secondary | ICD-10-CM | POA: Insufficient documentation

## 2021-05-12 DIAGNOSIS — J069 Acute upper respiratory infection, unspecified: Secondary | ICD-10-CM | POA: Insufficient documentation

## 2021-05-12 DIAGNOSIS — I10 Essential (primary) hypertension: Secondary | ICD-10-CM | POA: Insufficient documentation

## 2021-05-12 DIAGNOSIS — Z79899 Other long term (current) drug therapy: Secondary | ICD-10-CM | POA: Insufficient documentation

## 2021-05-12 DIAGNOSIS — Z7984 Long term (current) use of oral hypoglycemic drugs: Secondary | ICD-10-CM | POA: Insufficient documentation

## 2021-05-12 DIAGNOSIS — Z20822 Contact with and (suspected) exposure to covid-19: Secondary | ICD-10-CM | POA: Insufficient documentation

## 2021-05-12 LAB — RESP PANEL BY RT-PCR (FLU A&B, COVID) ARPGX2
Influenza A by PCR: NEGATIVE
Influenza B by PCR: NEGATIVE
SARS Coronavirus 2 by RT PCR: NEGATIVE

## 2021-05-12 MED ORDER — BENZONATATE 100 MG PO CAPS
100.0000 mg | ORAL_CAPSULE | Freq: Once | ORAL | Status: AC
  Administered 2021-05-12: 100 mg via ORAL
  Filled 2021-05-12: qty 1

## 2021-05-12 MED ORDER — ACETAMINOPHEN 500 MG PO TABS
1000.0000 mg | ORAL_TABLET | Freq: Once | ORAL | Status: AC
  Administered 2021-05-12: 1000 mg via ORAL
  Filled 2021-05-12: qty 2

## 2021-05-12 MED ORDER — BENZONATATE 100 MG PO CAPS: 100.0000 mg | ORAL_CAPSULE | Freq: Three times a day (TID) | ORAL | 0 refills | Status: DC

## 2021-05-12 NOTE — ED Provider Notes (Signed)
Emergency Medicine Provider Triage Evaluation Note  Tanya Mason , a 60 y.o. female  was evaluated in triage.  Pt complains of cough, fever, body aches, headache.  Symptoms present and ongoing for the last 8 days.  Cough productive of clear phlegm.  Reports sore throat and headache currently.  Has had fevers at home.  Chest pain only when coughing.  Unvaccinated, unsure of any sick contacts.  Review of Systems  Positive: Cough, fever, headache, sore throat Negative: Chest pain, shortness of breath  Physical Exam  BP (!) 158/93   Pulse 91   Temp 98.1 F (36.7 C)   LMP  (LMP Unknown)   SpO2 100%  Gen:   Awake, no distress   Resp:  Normal effort, CTA bilat MSK:   Moves extremities without difficulty  Other:    Medical Decision Making  Medically screening exam initiated at 1:20 AM.  Appropriate orders placed.  Tanya Mason was informed that the remainder of the evaluation will be completed by another provider, this initial triage assessment does not replace that evaluation, and the importance of remaining in the ED until their evaluation is complete.     Dartha Lodge, PA-C 05/12/21 0127    Shon Baton, MD 05/12/21 0200

## 2021-05-12 NOTE — ED Triage Notes (Signed)
Pt reports that she has been coughing up clear phlegm and fevers for the past 8 days. Sore throat and headache as well. Pt is unvaccinated.

## 2021-05-12 NOTE — ED Provider Notes (Signed)
[facility] EMERGENCY DEPARTMENT Provider Note   CSN: [id number] Arrival date & time: 05/12/21  0115     History Chief Complaint  Patient presents with  . Cough    Tanya Mason Tanya Mason is a 60 y.o. female.  Patient presents to the ED with a chief complaint of cough.  She states that she has been having a cough for the past 8 days.  She denies any fevers.  She states that the coughing makes her ribs and upper abdomen hurt.  She states that she is scheduled to see her doctor on the 26th, in 2 days.  She states that she needs something to help ease the cough so she can sleep.  The history is provided by the patient. The history is limited by a language barrier. A language interpreter was used.       Past Medical History:  Diagnosis Date  . Diabetes 1.5, managed as type 2 (HCC)   . Diabetes mellitus without complication (HCC)   . Hypertension     Patient Active Problem List   Diagnosis Date Noted  . Mixed hyperlipidemia 04/30/2021  . LBBB (left bundle branch block) 04/30/2021  . Atypical chest pain 03/30/2021    Past Surgical History:  Procedure Laterality Date  . ABDOMINAL HYSTERECTOMY    . CHOLECYSTECTOMY       OB History   No obstetric history on file.     Family History  Family history unknown: Yes    Social History   Tobacco Use  . Smoking status: Never Smoker  . Smokeless tobacco: Never Used  Vaping Use  . Vaping Use: Never used  Substance Use Topics  . Alcohol use: Never  . Drug use: Never    Home Medications Prior to Admission medications   Medication Sig Start Date End Date Taking? Authorizing Provider  benzonatate (TESSALON) 100 MG capsule Take 1 capsule (100 mg total) by mouth every 8 (eight) hours. 05/12/21  Yes Roxy Horseman, PA-C  atorvastatin (LIPITOR) 40 MG tablet Take 1 tablet (40 mg total) by mouth daily. 03/31/21 06/29/21  Darlin Drop, DO  isosorbide mononitrate (IMDUR) 30 MG 24 hr tablet Take 1 tablet  (30 mg total) by mouth daily. 03/31/21 04/30/21  Darlin Drop, DO  metFORMIN (GLUCOPHAGE-XR) 500 MG 24 hr tablet Take 1 tablet (500 mg total) by mouth daily with breakfast. 03/31/21 06/29/21  Darlin Drop, DO  Multiple Vitamin (MULTIVITAMIN) tablet Take 1 tablet by mouth daily.    [provider]    Allergies    Aspirin and Penicillins  Review of Systems   Review of Systems  All other systems reviewed and are negative.   Physical Exam Updated Vital Signs BP 133/75 (BP Location: Left Arm)   Pulse 72   Temp 98.3 F (36.8 C) (Oral)   Resp 18   LMP  (LMP Unknown)   SpO2 98%   Physical Exam Vitals and nursing note reviewed.  Constitutional:      General: She is not in acute distress.    Appearance: She is well-developed.  HENT:     Head: Normocephalic and atraumatic.  Eyes:     Conjunctiva/sclera: Conjunctivae normal.  Cardiovascular:     Rate and Rhythm: Normal rate.     Heart sounds: No murmur heard.   Pulmonary:     Effort: Pulmonary effort is normal. No respiratory distress.  Abdominal:     General: There is no distension.  Musculoskeletal:  Cervical back: Neck supple.     Comments: Moves all extremities  Skin:    General: Skin is warm and dry.  Neurological:     Mental Status: She is alert and oriented to person, place, and time.  Psychiatric:        Mood and Affect: Mood normal.        Behavior: Behavior normal.     ED Results / Procedures / Treatments   Labs (all labs ordered are listed, but only abnormal results are displayed) Labs Reviewed  RESP PANEL BY RT-PCR (FLU A&B, COVID) ARPGX2    EKG None  Radiology DG Chest Port 1 View  Result Date: 05/12/2021 CLINICAL DATA:  Cough, question COVID. EXAM: PORTABLE CHEST 1 VIEW COMPARISON:  Chest x-ray 04/16/2021 FINDINGS: The heart size and mediastinal contours are unchanged. Aortic calcification. No focal consolidation. No pulmonary edema. No pleural effusion. No pneumothorax. No acute  osseous abnormality. IMPRESSION: No active disease. Electronically Signed   By: Tish Frederickson M.D.   On: 05/12/2021 02:49    Procedures Procedures   Medications Ordered in ED Medications  acetaminophen (TYLENOL) tablet 1,000 mg (has no administration in time range)  benzonatate (TESSALON) capsule 100 mg (has no administration in time range)    ED Course  I have reviewed the triage vital signs and the nursing notes.  Pertinent labs & imaging results that were available during my care of the patient were reviewed by me and considered in my medical decision making (see chart for details).    MDM Rules/Calculators/A&P                          Pt CXR negative for acute infiltrate. Patients symptoms are consistent with URI, likely viral etiology. Discussed that antibiotics are not indicated for viral infections. Pt will be discharged with symptomatic treatment.  Verbalizes understanding and is agreeable with plan. Pt is hemodynamically stable & in NAD prior to dc.  Final Clinical Impression(s) / ED Diagnoses Final diagnoses:  Viral URI with cough    Rx / DC Orders ED Discharge Orders         Ordered    benzonatate (TESSALON) 100 MG capsule  Every 8 hours        05/12/21 0444           Roxy Horseman, PA-C 05/12/21 0501    Dione Booze, MD 05/12/21 [phone]

## 2021-05-14 ENCOUNTER — Encounter: Payer: Self-pay | Admitting: Physician Assistant

## 2021-05-14 ENCOUNTER — Ambulatory Visit: Payer: Self-pay | Attending: Physician Assistant | Admitting: Physician Assistant

## 2021-05-14 ENCOUNTER — Other Ambulatory Visit: Payer: Self-pay

## 2021-05-14 DIAGNOSIS — Z09 Encounter for follow-up examination after completed treatment for conditions other than malignant neoplasm: Secondary | ICD-10-CM

## 2021-05-14 DIAGNOSIS — R059 Cough, unspecified: Secondary | ICD-10-CM

## 2021-05-14 DIAGNOSIS — Z789 Other specified health status: Secondary | ICD-10-CM

## 2021-05-14 DIAGNOSIS — R0789 Other chest pain: Secondary | ICD-10-CM

## 2021-05-14 DIAGNOSIS — E782 Mixed hyperlipidemia: Secondary | ICD-10-CM

## 2021-05-14 DIAGNOSIS — E1165 Type 2 diabetes mellitus with hyperglycemia: Secondary | ICD-10-CM

## 2021-05-14 MED ORDER — BENZONATATE 100 MG PO CAPS: 200.0000 mg | ORAL_CAPSULE | Freq: Three times a day (TID) | ORAL | 0 refills | Status: DC | PRN

## 2021-05-14 MED ORDER — METFORMIN HCL ER 500 MG PO TB24: ORAL_TABLET | ORAL | 3 refills | Status: DC

## 2021-05-14 MED ORDER — ATORVASTATIN CALCIUM 40 MG PO TABS: 40.0000 mg | ORAL_TABLET | Freq: Every day | ORAL | 0 refills | Status: DC

## 2021-05-14 MED ORDER — ISOSORBIDE MONONITRATE ER 30 MG PO TB24: 30.0000 mg | ORAL_TABLET | Freq: Every day | ORAL | 3 refills | Status: DC

## 2021-05-14 NOTE — Progress Notes (Signed)
Virtual Visit via Telephone Note  I connected with Tanya Mason on 05/14/21 at 10:30 AM EDT by telephone and verified that I am speaking with the correct person using two identifiers.  Location: Patient: home Provider: [facility]  Alex with [employer]  I discussed the limitations, risks, security and privacy concerns of performing an evaluation and management service by telephone and the availability of in person appointments. I also discussed with the patient that there may be a patient responsible charge related to this service. The patient expressed understanding and agreed to proceed.   History of Present Illness: Was seen at ED 05/12/2021 for a cough and had a negative covid test.  She has had a cough now for about 10 days.  No discolored mucus and it seems to be getting better.    Blood sugars running from 150-280 over the last few weeks.  Taking 100mg  metformin daily.  Last A1C=6.5 1 month ago.    There seems to have been a misunderstanding bc she stopped her Imdur.  However from her last visit 2 weeks ago, it was to be continued and listed on her AVS that I am able to see in her chart.  Her BP has been elevated since she stopped taking it and caused her HA.  MRI brain 03/2021 was without acute findings    Observations/Objective:  A&Ox3.  NAD   Assessment and Plan: 1. Type 2 diabetes mellitus with hyperglycemia, unspecified whether long term insulin use (HCC) Due to persistently elevated blood sugars at home and none under 150, will increase metformin - metFORMIN (GLUCOPHAGE-XR) 500 MG 24 hr tablet; 2 tabs in the morning and 1 at night  Dispense: 90 tablet; Refill: 3  2. Mixed hyperlipidemia - atorvastatin (LIPITOR) 40 MG tablet; Take 1 tablet (40 mg total) by mouth daily.  Dispense: 90 tablet; Refill: 0  3. Atypical chest pain continue - isosorbide mononitrate (IMDUR) 30 MG 24 hr tablet; Take 1 tablet (30 mg total) by mouth daily.  Dispense: 30 tablet;  Refill: 3  4. Cough This has helped and cough is improving - benzonatate (TESSALON) 100 MG capsule; Take 2 capsules (200 mg total) by mouth 3 (three) times daily as needed for cough.  Dispense: 40 capsule; Refill: 0  5. Language barrier Pacific interpreters used and additional time performing visit was required. The patient repeated the same issues multiple times even after we addressed them and came up with a plan causing 04/2021 to take a lot of time   6. Encounter for examination following treatment at hospital    Follow Up Instructions: Assign PCP for chronic conditions in 2 months   I discussed the assessment and treatment plan with the patient. The patient was provided an opportunity to ask questions and all were answered. The patient agreed with the plan and demonstrated an understanding of the instructions.   The patient was advised to call back or seek an in-person evaluation if the symptoms worsen or if the condition fails to improve as anticipated.  I provided 32 minutes of non-face-to-face time during this encounter.   [place], PA-C  Patient ID: Tanya Mason, female   DOB: 1961/09/08, 60 y.o.   MRN: [id number]

## 2021-05-14 NOTE — Addendum Note (Signed)
Addended by: Anders Simmonds on: 05/14/2021 12:08 PM   Modules accepted: Orders

## 2021-05-27 ENCOUNTER — Ambulatory Visit: Payer: Self-pay | Admitting: *Deleted

## 2021-05-27 NOTE — Telephone Encounter (Signed)
Via interpreter [phone] Meraly-Patient has history of fainting. She is not currently established with [facility] but has upcoming appointment with [facility]/Michelle Randa Evens, NP on 06/09/21. She was prescribed to take an aspiring daily by the ED. Wants to know should she continue with the daily ASA. Advised to continue medication until her 06/09/21 appointment and if she faints or has dizziness with CP/SOB she should call 911. Patient understood directions.  Reason for Disposition . Simple fainting is a chronic symptom (has occurred multiple times)  Answer Assessment - Initial Assessment Questions 1. ONSET: "How long were you unconscious?" (minutes) "When did it happen?"   unknown 2. CONTENT: "What happened during period of unconsciousness?" (e.g., seizure activity)      unsure 3. MENTAL STATUS: "Alert and oriented now?" (oriented x 3 = name, month, location)      yes 4. TRIGGER: "What do you think caused the fainting?" "What were you doing just before you fainted?"  (e.g., exercise, sudden standing up, prolonged standing)     unsure 5. RECURRENT SYMPTOM: "Have you ever passed out before?" If Yes, ask: "When was the last time?" and "What happened that time?"      yes 6. INJURY: "Did you sustain any injury during the fall?"      no 7. CARDIAC SYMPTOMS: "Have you had any of the following symptoms: chest pain, difficulty breathing, palpitations?"      8. NEUROLOGIC SYMPTOMS: "Have you had any of the following symptoms: headache, numbness, vertigo, weakness?"     White hands and they turn cold 9. GI SYMPTOMS: "Have you had any of the following symptoms: abdominal pain, vomiting, diarrhea, blood in stools?"     none 10. OTHER SYMPTOMS: "Do you have any other symptoms?"       Fainting feeling awhile back 11. PREGNANCY: "Is there any chance you are pregnant?" "When was your last menstrual period?"       na  Protocols used: [facility]

## 2021-06-09 ENCOUNTER — Telehealth (INDEPENDENT_AMBULATORY_CARE_PROVIDER_SITE_OTHER): Payer: Self-pay | Admitting: Primary Care

## 2021-07-17 ENCOUNTER — Ambulatory Visit: Payer: Medicaid Other | Admitting: Internal Medicine

## 2021-08-05 ENCOUNTER — Other Ambulatory Visit: Payer: Self-pay

## 2021-08-05 ENCOUNTER — Ambulatory Visit: Payer: Self-pay | Attending: Physician Assistant | Admitting: Physician Assistant

## 2021-08-05 ENCOUNTER — Encounter: Payer: Self-pay | Admitting: Physician Assistant

## 2021-08-05 VITALS — BP 143/83 | HR 80 | Temp 98.0°F | Ht 62.0 in | Wt 132.0 lb

## 2021-08-05 DIAGNOSIS — E1165 Type 2 diabetes mellitus with hyperglycemia: Secondary | ICD-10-CM

## 2021-08-05 DIAGNOSIS — Z603 Acculturation difficulty: Secondary | ICD-10-CM

## 2021-08-05 DIAGNOSIS — Z789 Other specified health status: Secondary | ICD-10-CM

## 2021-08-05 DIAGNOSIS — R29898 Other symptoms and signs involving the musculoskeletal system: Secondary | ICD-10-CM

## 2021-08-05 DIAGNOSIS — E782 Mixed hyperlipidemia: Secondary | ICD-10-CM

## 2021-08-05 DIAGNOSIS — R0789 Other chest pain: Secondary | ICD-10-CM

## 2021-08-05 DIAGNOSIS — R300 Dysuria: Secondary | ICD-10-CM

## 2021-08-05 LAB — POCT URINALYSIS DIP (CLINITEK)
Bilirubin, UA: NEGATIVE
Glucose, UA: NEGATIVE mg/dL
Ketones, POC UA: NEGATIVE mg/dL
Leukocytes, UA: NEGATIVE
Nitrite, UA: NEGATIVE
POC PROTEIN,UA: NEGATIVE
Spec Grav, UA: 1.015 (ref 1.010–1.025)
Urobilinogen, UA: 0.2 E.U./dL
pH, UA: 7.5 (ref 5.0–8.0)

## 2021-08-05 LAB — GLUCOSE, POCT (MANUAL RESULT ENTRY): POC Glucose: 169 mg/dl — AB (ref 70–99)

## 2021-08-05 LAB — POCT GLYCOSYLATED HEMOGLOBIN (HGB A1C): Hemoglobin A1C: 5.8 % — AB (ref 4.0–5.6)

## 2021-08-05 MED ORDER — SULFAMETHOXAZOLE-TRIMETHOPRIM 800-160 MG PO TABS: 1.0000 | ORAL_TABLET | Freq: Two times a day (BID) | ORAL | 0 refills | Status: DC

## 2021-08-05 MED ORDER — ATORVASTATIN CALCIUM 40 MG PO TABS: 40.0000 mg | ORAL_TABLET | Freq: Every day | ORAL | 1 refills | Status: DC

## 2021-08-05 MED ORDER — ISOSORBIDE MONONITRATE ER 30 MG PO TB24: 30.0000 mg | ORAL_TABLET | Freq: Every day | ORAL | 1 refills | Status: DC

## 2021-08-05 MED ORDER — FLUCONAZOLE 150 MG PO TABS: 150.0000 mg | ORAL_TABLET | Freq: Once | ORAL | 0 refills | Status: AC

## 2021-08-05 MED ORDER — METFORMIN HCL ER 500 MG PO TB24: ORAL_TABLET | ORAL | 3 refills | Status: DC

## 2021-08-05 NOTE — Progress Notes (Signed)
Pt states she did not sleep last night due to urinating all night and she has abdominal pain

## 2021-08-05 NOTE — Progress Notes (Signed)
Patient ID: Tanya Mason, female   DOB: 06-May-1961, 60 y.o.   MRN: [id number]     Tanya Mason, is a 60 y.o. female  [id number]:[id number]  YTK:[id number]  DOB - 12/29/1960  Chief Complaint  Patient presents with   Follow-up    Medication        Subjective:   Tanya Mason is a 60 y.o. female here today for a follow up visit and for med RF.  She has her glucometer with her and says she takes metformin consistently.  Was out of imdur and cholesterol med.  Denies HA/CP/dizziness.  Blood sugars running mostly in low 100s; none over 200.  Intermittent frequency and dysuria.  Going on several months.  Some pelvic discomfort.  No fever.  No abdominal pain.  No vaginal discharge.  Pain is mild-it did keep her from sleeping well last night.  C/o leg weakness at times for several months.  No falls.  Not off balance.  No dizziness.    She is about to go to [place] for 3 months.  Needs 90 days supply    No problems updated.  ALLERGIES: Allergies  Allergen Reactions   Aspirin Other (See Comments)   Penicillins Itching and Rash    PAST MEDICAL HISTORY: Past Medical History:  Diagnosis Date   Diabetes 1.5, managed as type 2 (HCC)    Diabetes mellitus without complication (HCC)    Hypertension     MEDICATIONS AT HOME: Prior to Admission medications   Medication Sig Start Date End Date Taking? Authorizing Provider  atorvastatin (LIPITOR) 40 MG tablet Take 1 tablet (40 mg total) by mouth daily. 08/05/21 11/03/21  Anders Simmonds, PA-C  isosorbide mononitrate (IMDUR) 30 MG 24 hr tablet Take 1 tablet (30 mg total) by mouth daily. 08/05/21 09/04/21  Anders Simmonds, PA-C  metFORMIN (GLUCOPHAGE-XR) 500 MG 24 hr tablet 2 tabs in the morning and 1 at night 08/05/21   Georgian Co M, PA-C  Multiple Vitamin (MULTIVITAMIN) tablet Take 1 tablet by mouth daily. Patient not taking: Reported on 08/05/2021    [provider]    ROS  otherwise: Neg HEENT Neg resp Neg cardiac Neg GI GU see above Neg MS Neg psych Neg neuro  Objective:   Vitals:   08/05/21 0913  BP: (!) 143/83  Pulse: 80  Temp: 98 F (36.7 C)  TempSrc: Oral  SpO2: 99%  Weight: 132 lb (59.9 kg)  Height: 5\' 2"  (1.575 m)   Exam General appearance : Awake, alert, not in any distress. Speech Clear. Not toxic looking HEENT: Atraumatic and Normocephalic, pupils equally reactive to light and accomodation Neck: Supple, no JVD. No cervical lymphadenopathy.  Chest: Good air entry bilaterally, CTAB.  No rales/rhonchi/wheezing CVS: S1 S2 regular, no murmurs.  Abdomen: Bowel sounds present, Non tender and not distended with no gaurding, rigidity or rebound. Extremities: B/L Lower Ext shows no edema, both legs are warm to touch Neurology: Awake alert, and oriented X 3, CN II-XII intact, Non focal Skin: No Rash  Data Review Lab Results  Component Value Date   HGBA1C 5.8 (A) 08/05/2021   HGBA1C 6.5 (H) 03/31/2021   HGBA1C 6.6 (H) 03/30/2021    Assessment & Plan   1. Type 2 diabetes mellitus with hyperglycemia, unspecified whether long term insulin use (HCC) Controlled-continue - Glucose (CBG) - Lipid panel - metFORMIN (GLUCOPHAGE-XR) 500 MG 24 hr tablet; 2 tabs in the morning and 1 at night  Dispense: 90  tablet; Refill: 3 - Comprehensive metabolic panel - HgB A1c - CBC with Differential/Platelet  2. Mixed hyperlipidemia - Lipid panel - atorvastatin (LIPITOR) 40 MG tablet; Take 1 tablet (40 mg total) by mouth daily.  Dispense: 90 tablet; Refill: 1 - Comprehensive metabolic panel - HgB A1c  3. Atypical chest pain No CP in a long time - isosorbide mononitrate (IMDUR) 30 MG 24 hr tablet; Take 1 tablet (30 mg total) by mouth daily.  Dispense: 90 tablet; Refill: 1  4. Dysuria with hematuria Septra ds and diflucan sent.  Increase water intake.   Culture sent - POCT URINALYSIS DIP (CLINITEK)  5. Weakness of both lower extremities -  Thyroid Panel With TSH - Vitamin D, 25-hydroxy - CBC with Differential/Platelet  6. Language barrier [employer] interpreters (Alejandro)used and additional time performing visit was required.   Patient have been counseled extensively about nutrition and exercise. Other issues discussed during this visit include: low cholesterol diet, weight control and daily exercise, foot care, annual eye examinations at Ophthalmology, importance of adherence with medications and regular follow-up. We also discussed long term complications of uncontrolled diabetes and hypertension.   Return in about 6 months (around 02/05/2022) for 4-6 months assign PCP.  The patient was given clear instructions to go to ER or return to medical center if symptoms don't improve, worsen or new problems develop. The patient verbalized understanding. The patient was told to call to get lab results if they haven't heard anything in the next week.      Georgian Co, PA-C [facility] [place], [place] [phone]   08/05/2021, 9:36 AM

## 2021-08-06 LAB — VITAMIN D 25 HYDROXY (VIT D DEFICIENCY, FRACTURES): Vit D, 25-Hydroxy: 43.9 ng/mL (ref 30.0–100.0)

## 2021-08-06 LAB — CBC WITH DIFFERENTIAL/PLATELET
Basophils Absolute: 0.1 10*3/uL (ref 0.0–0.2)
Basos: 1 %
EOS (ABSOLUTE): 0.1 10*3/uL (ref 0.0–0.4)
Eos: 1 %
Hematocrit: 43.4 % (ref 34.0–46.6)
Hemoglobin: 14 g/dL (ref 11.1–15.9)
Immature Grans (Abs): 0 10*3/uL (ref 0.0–0.1)
Immature Granulocytes: 0 %
Lymphocytes Absolute: 2.2 10*3/uL (ref 0.7–3.1)
Lymphs: 39 %
MCH: 27.1 pg (ref 26.6–33.0)
MCHC: 32.3 g/dL (ref 31.5–35.7)
MCV: 84 fL (ref 79–97)
Monocytes Absolute: 0.3 10*3/uL (ref 0.1–0.9)
Monocytes: 6 %
Neutrophils Absolute: 3 10*3/uL (ref 1.4–7.0)
Neutrophils: 53 %
Platelets: 357 10*3/uL (ref 150–450)
RBC: 5.16 x10E6/uL (ref 3.77–5.28)
RDW: 13.2 % (ref 11.7–15.4)
WBC: 5.6 10*3/uL (ref 3.4–10.8)

## 2021-08-06 LAB — COMPREHENSIVE METABOLIC PANEL
ALT: 16 IU/L (ref 0–32)
AST: 19 IU/L (ref 0–40)
Albumin/Globulin Ratio: 1.6 (ref 1.2–2.2)
Albumin: 4.6 g/dL (ref 3.8–4.8)
Alkaline Phosphatase: 71 IU/L (ref 44–121)
BUN/Creatinine Ratio: 13 (ref 12–28)
BUN: 9 mg/dL (ref 8–27)
Bilirubin Total: 0.2 mg/dL (ref 0.0–1.2)
CO2: 26 mmol/L (ref 20–29)
Calcium: 9.6 mg/dL (ref 8.7–10.3)
Chloride: 99 mmol/L (ref 96–106)
Creatinine, Ser: 0.72 mg/dL (ref 0.57–1.00)
Globulin, Total: 2.8 g/dL (ref 1.5–4.5)
Glucose: 121 mg/dL — ABNORMAL HIGH (ref 65–99)
Potassium: 4.4 mmol/L (ref 3.5–5.2)
Sodium: 139 mmol/L (ref 134–144)
Total Protein: 7.4 g/dL (ref 6.0–8.5)
eGFR: 94 mL/min/{1.73_m2} (ref >59)

## 2021-08-06 LAB — LIPID PANEL
Chol/HDL Ratio: 3.6 ratio (ref 0.0–4.4)
Cholesterol, Total: 203 mg/dL — ABNORMAL HIGH (ref 100–199)
HDL: 57 mg/dL (ref >39)
LDL Chol Calc (NIH): 115 mg/dL — ABNORMAL HIGH (ref 0–99)
Triglycerides: 181 mg/dL — ABNORMAL HIGH (ref 0–149)
VLDL Cholesterol Cal: 31 mg/dL (ref 5–40)

## 2021-08-06 LAB — THYROID PANEL WITH TSH
Free Thyroxine Index: 2.8 (ref 1.2–4.9)
T3 Uptake Ratio: 27 % (ref 24–39)
T4, Total: 10.2 ug/dL (ref 4.5–12.0)
TSH: 0.902 u[IU]/mL (ref 0.450–4.500)

## 2021-08-08 LAB — URINE CULTURE

## 2021-09-04 ENCOUNTER — Encounter: Payer: Self-pay | Admitting: *Deleted

## 2021-11-28 ENCOUNTER — Emergency Department (HOSPITAL_COMMUNITY): Payer: Medicaid Other

## 2021-11-28 ENCOUNTER — Emergency Department (HOSPITAL_COMMUNITY)
Admission: EM | Admit: 2021-11-28 | Discharge: 2021-11-28 | Disposition: A | Discharge status: Home / Self Care | Payer: Medicaid Other | Attending: Emergency Medicine | Admitting: Emergency Medicine

## 2021-11-28 ENCOUNTER — Other Ambulatory Visit: Payer: Self-pay

## 2021-11-28 DIAGNOSIS — I10 Essential (primary) hypertension: Secondary | ICD-10-CM | POA: Insufficient documentation

## 2021-11-28 DIAGNOSIS — K6289 Other specified diseases of anus and rectum: Secondary | ICD-10-CM | POA: Insufficient documentation

## 2021-11-28 DIAGNOSIS — Z7984 Long term (current) use of oral hypoglycemic drugs: Secondary | ICD-10-CM | POA: Insufficient documentation

## 2021-11-28 DIAGNOSIS — E119 Type 2 diabetes mellitus without complications: Secondary | ICD-10-CM | POA: Insufficient documentation

## 2021-11-28 DIAGNOSIS — R519 Headache, unspecified: Secondary | ICD-10-CM | POA: Insufficient documentation

## 2021-11-28 DIAGNOSIS — G8929 Other chronic pain: Secondary | ICD-10-CM | POA: Insufficient documentation

## 2021-11-28 LAB — CBC WITH DIFFERENTIAL/PLATELET
Abs Immature Granulocytes: 0.01 10*3/uL (ref 0.00–0.07)
Basophils Absolute: 0.1 10*3/uL (ref 0.0–0.1)
Basophils Relative: 1 %
Eosinophils Absolute: 0.1 10*3/uL (ref 0.0–0.5)
Eosinophils Relative: 1 %
HCT: 39.6 % (ref 36.0–46.0)
Hemoglobin: 12.6 g/dL (ref 12.0–15.0)
Immature Granulocytes: 0 %
Lymphocytes Relative: 42 %
Lymphs Abs: 3.5 10*3/uL (ref 0.7–4.0)
MCH: 27 pg (ref 26.0–34.0)
MCHC: 31.8 g/dL (ref 30.0–36.0)
MCV: 84.8 fL (ref 80.0–100.0)
Monocytes Absolute: 0.5 10*3/uL (ref 0.1–1.0)
Monocytes Relative: 5 %
Neutro Abs: 4.2 10*3/uL (ref 1.7–7.7)
Neutrophils Relative %: 51 %
Platelets: 345 10*3/uL (ref 150–400)
RBC: 4.67 MIL/uL (ref 3.87–5.11)
RDW: 13.1 % (ref 11.5–15.5)
WBC: 8.3 10*3/uL (ref 4.0–10.5)
nRBC: 0 % (ref 0.0–0.2)

## 2021-11-28 LAB — BASIC METABOLIC PANEL
Anion gap: 9 (ref 5–15)
BUN: 12 mg/dL (ref 8–23)
CO2: 28 mmol/L (ref 22–32)
Calcium: 9.5 mg/dL (ref 8.9–10.3)
Chloride: 101 mmol/L (ref 98–111)
Creatinine, Ser: 0.63 mg/dL (ref 0.44–1.00)
GFR, Estimated: >60 mL/min (ref >60)
Glucose, Bld: 118 mg/dL — ABNORMAL HIGH (ref 70–99)
Potassium: 3.9 mmol/L (ref 3.5–5.1)
Sodium: 138 mmol/L (ref 135–145)

## 2021-11-28 LAB — TROPONIN I (HIGH SENSITIVITY)
Troponin I (High Sensitivity): 3 ng/L (ref <18)
Troponin I (High Sensitivity): 2 ng/L (ref <18)

## 2021-11-28 MED ORDER — SODIUM CHLORIDE 0.9 % IV BOLUS
1000.0000 mL | Freq: Once | INTRAVENOUS | Status: AC
  Administered 2021-11-28: 1000 mL via INTRAVENOUS

## 2021-11-28 MED ORDER — LORAZEPAM 2 MG/ML IJ SOLN
0.5000 mg | Freq: Once | INTRAMUSCULAR | Status: AC
  Administered 2021-11-28: 0.5 mg via INTRAVENOUS
  Filled 2021-11-28: qty 1

## 2021-11-28 MED ORDER — MORPHINE SULFATE (PF) 4 MG/ML IV SOLN
4.0000 mg | Freq: Once | INTRAVENOUS | Status: AC
  Administered 2021-11-28: 4 mg via INTRAVENOUS
  Filled 2021-11-28: qty 1

## 2021-11-28 MED ORDER — PROCHLORPERAZINE EDISYLATE 10 MG/2ML IJ SOLN
10.0000 mg | Freq: Once | INTRAMUSCULAR | Status: AC
  Administered 2021-11-28: 10 mg via INTRAVENOUS
  Filled 2021-11-28: qty 2

## 2021-11-28 MED ORDER — METOCLOPRAMIDE HCL 10 MG PO TABS
10.0000 mg | ORAL_TABLET | Freq: Once | ORAL | Status: AC
  Administered 2021-11-28: 10 mg via ORAL
  Filled 2021-11-28 (×2): qty 1

## 2021-11-28 MED ORDER — IOHEXOL 300 MG/ML  SOLN
100.0000 mL | Freq: Once | INTRAMUSCULAR | Status: AC | PRN
  Administered 2021-11-28: 100 mL via INTRAVENOUS

## 2021-11-28 MED ORDER — LORAZEPAM 2 MG/ML IJ SOLN
0.5000 mg | Freq: Once | INTRAMUSCULAR | Status: DC
  Filled 2021-11-28: qty 1

## 2021-11-28 MED ORDER — HYDROCORTISONE (PERIANAL) 2.5 % EX CREA: 1.0000 "application " | TOPICAL_CREAM | Freq: Two times a day (BID) | CUTANEOUS | 0 refills | Status: DC

## 2021-11-28 NOTE — ED Triage Notes (Signed)
Pt endorses headache and chest pain for the past 2 days.

## 2021-11-28 NOTE — ED Provider Notes (Signed)
[facility] EMERGENCY DEPARTMENT Provider Note   CSN: [id number] Arrival date & time: 11/28/21  0109     History Headache   Tanya Mason is a 60 y.o. female past medical history significant for diabetes, LBBB presents for evaluation of 2 complaints.  Patient states she has had a headache, throbbing, pulsating in her head. Non positional in nature.  States she does not have history of headaches however it is documented that she does have history of chronic headache.  Taking Tylenol at home without relief.  No vision changes, weakness, slurred speech, difficulty with word finding.  Noted with triage provider that she had some chest pain however denies any CP to myself. Does admit to rectal pain, Diarrhea 2 days ago however self resolved. States worse when she sits on her rectum. No hx of hemorrhoids, abscess. No fever, emesis, CP, SOB, cough, melena, BRBPR, weakness. Rates her pain a 9/10. Denies additional aggravating or alleviating factors.  History obtained from patient and past medical records. Medical spanish interpretor was used.  HPI     Past Medical History:  Diagnosis Date   Diabetes 1.5, managed as type 2 ([place])    Diabetes mellitus without complication ([place])    Hypertension     Patient Active Problem List   Diagnosis Date Noted   Mixed hyperlipidemia 04/30/2021   LBBB (left bundle branch block) 04/30/2021   Atypical chest pain 03/30/2021    Past Surgical History:  Procedure Laterality Date   ABDOMINAL HYSTERECTOMY     CHOLECYSTECTOMY       OB History   No obstetric history on file.     Family History  Family history unknown: Yes    Social History   Tobacco Use   Smoking status: Never   Smokeless tobacco: Never  Vaping Use   Vaping Use: Never used  Substance Use Topics   Alcohol use: Never   Drug use: Never    Home Medications Prior to Admission medications   Medication Sig Start Date End Date Taking? Authorizing  Provider  hydrocortisone (ANUSOL-HC) 2.5 % rectal cream Place 1 application rectally 2 (two) times daily. 11/28/21  Yes Isahia Hollerbach A, PA-C  atorvastatin (LIPITOR) 40 MG tablet Take 1 tablet (40 mg total) by mouth daily. 08/05/21 11/03/21  Argentina Donovan, PA-C  isosorbide mononitrate (IMDUR) 30 MG 24 hr tablet Take 1 tablet (30 mg total) by mouth daily. 08/05/21 09/04/21  Argentina Donovan, PA-C  metFORMIN (GLUCOPHAGE-XR) 500 MG 24 hr tablet 2 tabs in the morning and 1 at night 08/05/21   Freeman Caldron M, PA-C  Multiple Vitamin (MULTIVITAMIN) tablet Take 1 tablet by mouth daily. Patient not taking: Reported on 08/05/2021    [provider]  sulfamethoxazole-trimethoprim (BACTRIM DS) 800-160 MG tablet Take 1 tablet by mouth 2 (two) times daily. 08/05/21   Argentina Donovan, PA-C    Allergies    Aspirin and Penicillins  Review of Systems   Review of Systems  Constitutional: Negative.   HENT: Negative.    Respiratory: Negative.    Cardiovascular: Negative.   Gastrointestinal:  Positive for diarrhea (resolved) and rectal pain. Negative for abdominal distention, abdominal pain, anal bleeding, blood in stool, constipation, nausea and vomiting.  Genitourinary: Negative.   Musculoskeletal: Negative.   Neurological:  Positive for headaches. Negative for dizziness, tremors, seizures, syncope, facial asymmetry, speech difficulty, weakness, light-headedness and numbness.  All other systems reviewed and are negative.  Physical Exam Updated Vital Signs BP (!) 155/78 (  BP Location: Right Arm)   Pulse 78   Temp 97.9 F (36.6 C) (Oral)   Resp 16   LMP  (LMP Unknown)   SpO2 100%   Physical Exam Vitals and nursing note reviewed. Exam conducted with a chaperone present.  Constitutional:      General: She is not in acute distress.    Appearance: She is well-developed. She is not ill-appearing, toxic-appearing or diaphoretic.  HENT:     Head: Normocephalic and atraumatic.     Nose:  Nose normal.     Mouth/Throat:     Mouth: Mucous membranes are moist.  Eyes:     Pupils: Pupils are equal, round, and reactive to light.  Cardiovascular:     Rate and Rhythm: Normal rate.     Pulses: Normal pulses.          Radial pulses are 2+ on the right side and 2+ on the left side.       Dorsalis pedis pulses are 2+ on the right side and 2+ on the left side.     Heart sounds: Normal heart sounds.  Pulmonary:     Effort: Pulmonary effort is normal. No respiratory distress.     Breath sounds: Normal breath sounds.  Abdominal:     General: Bowel sounds are normal. There is no distension.     Palpations: Abdomen is soft. There is no mass.     Tenderness: There is no abdominal tenderness. There is no right CVA tenderness, left CVA tenderness, guarding or rebound.     Hernia: No hernia is present.  Genitourinary:    Exam position: Knee-chest position.     Comments: Chaperone significant pain with rectal exam without no obvious fissure, fluctuance, induration. Difficulty tolerating exam. Musculoskeletal:        General: Normal range of motion.     Cervical back: Normal range of motion.  Feet:     Comments: No edema, erythema or warmth.  No fluctuance or induration.  Equal pulses bilaterally Skin:    General: Skin is warm and dry.  Neurological:     General: No focal deficit present.     Mental Status: She is alert.     Cranial Nerves: Cranial nerves 2-12 are intact.     Sensory: Sensation is intact.     Motor: Motor function is intact.     Coordination: Coordination is intact.     Gait: Gait is intact.     Comments: Mental Status:  Alert, oriented, thought content appropriate. Speech fluent without evidence of aphasia. Able to follow 2 step commands without difficulty.  Cranial Nerves:  II:  Peripheral visual fields grossly normal, pupils equal, round, reactive to light III,IV, VI: ptosis not present, extra-ocular motions intact bilaterally  V,VII: smile symmetric, facial  light touch sensation equal VIII: hearing grossly normal bilaterally  IX,X: midline uvula rise  XI: bilateral shoulder shrug equal and strong XII: midline tongue extension  Motor:  5/5 in upper and lower extremities bilaterally including strong and equal grip strength and dorsiflexion/plantar flexion Sensory: Pinprick and light touch normal in all extremities.  Deep Tendon Reflexes: 2+ and symmetric  Cerebellar: normal finger-to-nose with bilateral upper extremities Gait: normal gait and balance CV: distal pulses palpable throughout    Psychiatric:        Mood and Affect: Mood normal.   ED Results / Procedures / Treatments   Labs (all labs ordered are listed, but only abnormal results are displayed) Labs Reviewed  BASIC METABOLIC  PANEL - Abnormal; Notable for the following components:      Result Value   Glucose, Bld 118 (*)    All other components within normal limits  CBC WITH DIFFERENTIAL/PLATELET  CBG MONITORING, ED  TROPONIN I (HIGH SENSITIVITY)  TROPONIN I (HIGH SENSITIVITY)    EKG EKG Interpretation  Date/Time:  Saturday November 28 2021 01:41:06 EST Ventricular Rate:  78 PR Interval:  168 QRS Duration: 140 QT Interval:  420 QTC Calculation: 478 R Axis:   68 Text Interpretation: Sinus rhythm with occasional Premature ventricular complexes Left bundle branch block Abnormal ECG No significant change since last tracing Confirmed by Melene Plan [id number]) on 11/28/2021 10:34:52 AM  Radiology DG Chest 2 View  Result Date: 11/28/2021 CLINICAL DATA:  Chest pain. EXAM: CHEST - 2 VIEW COMPARISON:  Chest radiograph dated 05/12/2021. FINDINGS: No focal consolidation, pleural effusion, pneumothorax. Mild chronic interstitial coarsening. The cardiac silhouette is within limits. Atherosclerotic calcification of the aorta. No acute osseous pathology. IMPRESSION: No active cardiopulmonary disease. Electronically Signed   By: Elgie Collard M.D.   On: 11/28/2021 02:25   CT HEAD WO  CONTRAST ( )  Result Date: 11/28/2021 CLINICAL DATA:  Headache EXAM: CT HEAD WITHOUT CONTRAST TECHNIQUE: Contiguous axial images were obtained from the base of the skull through the vertex without intravenous contrast. COMPARISON:  CT head 03/30/2021 FINDINGS: Brain: No acute intracranial hemorrhage, mass effect, or herniation. No extra-axial fluid collections. No evidence of acute territorial infarct. No hydrocephalus. Vascular: No hyperdense vessel or unexpected calcification. Skull: No acute fracture. Stable 12 mm lucent lesion in the left frontal bone. Sinuses/Orbits: No acute finding. Other: None. IMPRESSION: No acute intracranial process identified. Electronically Signed   By: Jannifer Hick M.D.   On: 11/28/2021 13:27   CT ABDOMEN PELVIS W CONTRAST  Result Date: 11/28/2021 CLINICAL DATA:  Abdominal pain EXAM: CT ABDOMEN AND PELVIS WITH CONTRAST TECHNIQUE: Multidetector CT imaging of the abdomen and pelvis was performed using the standard protocol following bolus administration of intravenous contrast. CONTRAST:  OMNIPAQUE IOHEXOL 300 MG/ML  SOLN COMPARISON:  CT abdomen and pelvis 09/23/2020 FINDINGS: Lower chest: Minor subsegmental atelectasis. Hepatobiliary: No focal liver abnormality is seen. Status post cholecystectomy. No biliary dilatation. Pancreas: Unremarkable. No pancreatic ductal dilatation or surrounding inflammatory changes. Spleen: Normal in size without focal abnormality. Adrenals/Urinary Tract: Adrenal glands are unremarkable. Kidneys are normal, without renal calculi, focal lesion, or hydronephrosis. Bladder is unremarkable. Stomach/Bowel: Tiny hiatal hernia. No bowel obstruction, free air or pneumatosis. No bowel wall edema. Scattered colonic diverticula. Appendix not visualized. Vascular/Lymphatic: No significant vascular findings are present. No enlarged abdominal or pelvic lymph nodes. Reproductive: Uterus and bilateral adnexa are unremarkable. Other: No abdominal wall  hernia or abnormality. No abdominopelvic ascites. Musculoskeletal: No acute or significant osseous findings. IMPRESSION: 1. No acute process identified. 2. Tiny hiatal hernia. 3. Mild colonic diverticulosis. Electronically Signed   By: Jannifer Hick M.D.   On: 11/28/2021 13:32    Procedures Procedures   Medications Ordered in ED Medications  LORazepam (ATIVAN) injection 0.5 mg (0.5 mg Intravenous Patient Refused/Not Given 11/28/21 1231)  metoCLOPramide (REGLAN) tablet 10 mg (10 mg Oral Given 11/28/21 0530)  sodium chloride 0.9 % bolus 1,000 mL (0 mLs Intravenous Stopped 11/28/21 1255)  morphine 4 MG/ML injection 4 mg (4 mg Intravenous Given 11/28/21 1120)  prochlorperazine (COMPAZINE) injection 10 mg (10 mg Intravenous Given 11/28/21 1120)  LORazepam (ATIVAN) injection 0.5 mg (0.5 mg Intravenous Given 11/28/21 1245)  iohexol (OMNIPAQUE) 300 MG/ML solution 100 mL (  100 mLs Intravenous Contrast Given 11/28/21 1309)   ED Course  I have reviewed the triage vital signs and the nursing notes.  Pertinent labs & imaging results that were available during my care of the patient were reviewed by me and considered in my medical decision making (see chart for details).  60 year old here for evaluation of multiple complaints.  He is afebrile, nonseptic, not ill-appearing.  Headache over the last 2 days unrelieved with Tylenol.  She has history of similar per chart review.  She has a nonfocal neuro exam without deficits.  I did review her imaging from April where she was admitted for headache, chest pain rule out which had MRI performed did not show any significant abnormality however recommended repeat CT scan in 3 to 6 months for possible bony abnormality to cranium.  Patient has not followed up with this.  Repeat CT scan here does not show any significant abnormality.  She received migraine cocktail with resolve of her headache.  She continues to have a nonfocal neuro exam without deficits, tolerating  p.o. intake.  We will have her follow-up outpatient for headaches.   Patient also with rectal pain.  Had some loose stools 2 days ago however no melena, blood per rectum, abdominal pain.  No urinary complaints.  Exam here without any obvious hemorrhoids, fissure, fluctuance or induration to suggest abscess however difficult exam due to patient's tolerance.  CT scan here does not show any significant abnormality to suggest infectious process colitis, perforation.  Question possible hemorrhoids.  Will DC home with Anusol cream however unclear etiology of her symptoms.  Apparently on triage exam had mentioned chest pain however she denies this with me.  Was admitted for chest pain rule out in April with no significant normality.  Her delta troponins here are reassuring, chest x-ray without any effusion, infectious process, edema, pneumothorax.  EKG without ischemic changes.  The patient has been appropriately medically screened and/or stabilized in the ED. I have low suspicion for any other emergent medical condition which would require further screening, evaluation or treatment in the ED or require inpatient management.  Patient is hemodynamically stable and in no acute distress.  Patient able to ambulate in department prior to ED.  Evaluation does not show acute pathology that would require ongoing or additional emergent interventions while in the emergency department or further inpatient treatment.  I have discussed the diagnosis with the patient and answered all questions.  Pain is been managed while in the emergency department and patient has no further complaints prior to discharge.  Patient is comfortable with plan discussed in room and is stable for discharge at this time.  I have discussed strict return precautions for returning to the emergency department.  Patient was encouraged to follow-up with PCP/specialist refer to at discharge.     MDM Rules/Calculators/A&P                             Final Clinical Impression(s) / ED Diagnoses Final diagnoses:  Chronic nonintractable headache, unspecified headache type  Rectal pain    Rx / DC Orders ED Discharge Orders          Ordered    hydrocortisone (ANUSOL-HC) 2.5 % rectal cream  2 times daily        11/28/21 1406             Carnell Casamento A, PA-C 11/28/21 Capitan, Clyde, DO 11/29/21 0703

## 2021-11-28 NOTE — ED Provider Notes (Signed)
Emergency Medicine Provider Triage Evaluation Note  Andria Head , a 60 y.o. female  was evaluated in triage.  Pt complains of a throbbing, pulsating pain in her head and feet which is brought on by ambulation. She has taken tylenol for symptoms w/o relief. Notes an associated pressure sensation in her left chest. Symptoms recurrent x 2 days. No N/V, head injury, or trauma. Not on chronic anticoagulation.  Review of Systems  Positive: As above Negative: As above  Physical Exam  BP (!) 131/108 (BP Location: Right Arm)   Pulse 82   Temp 97.7 F (36.5 C) (Oral)   Resp 18   LMP  (LMP Unknown)   SpO2 100%  Gen:   Awake, anxious appearing Resp:  Normal effort  MSK:   Moves extremities without difficulty  Other:  Heart RRR. No focal deficits noted. Ambulatory with steady gait.  Medical Decision Making  Medically screening exam initiated at 1:33 AM.  Appropriate orders placed.  [name] was informed that the remainder of the evaluation will be completed by another provider, this initial triage assessment does not replace that evaluation, and the importance of remaining in the ED until their evaluation is complete.  Multiple complaints - difficult historian due to rumination on present symptoms.   Antony Madura, PA-C 11/28/21 0136    Jacalyn Lefevre, MD 11/28/21 0400

## 2021-11-28 NOTE — Discharge Instructions (Addendum)
Use la crema en su recto. Puede tomar Tylenol e Ibuprofeno para el dolor de cabeza  Seguimiento con el proveedor de atencin primaria

## 2021-12-10 ENCOUNTER — Other Ambulatory Visit: Payer: Self-pay

## 2021-12-10 ENCOUNTER — Encounter: Payer: Self-pay | Admitting: Family Medicine

## 2021-12-10 ENCOUNTER — Ambulatory Visit: Payer: Medicaid Other | Attending: Family Medicine | Admitting: Family Medicine

## 2021-12-10 VITALS — BP 163/95 | HR 80 | Ht 62.0 in | Wt 135.2 lb

## 2021-12-10 DIAGNOSIS — G44229 Chronic tension-type headache, not intractable: Secondary | ICD-10-CM

## 2021-12-10 DIAGNOSIS — R3129 Other microscopic hematuria: Secondary | ICD-10-CM

## 2021-12-10 DIAGNOSIS — E1169 Type 2 diabetes mellitus with other specified complication: Secondary | ICD-10-CM

## 2021-12-10 DIAGNOSIS — K5909 Other constipation: Secondary | ICD-10-CM

## 2021-12-10 DIAGNOSIS — K6289 Other specified diseases of anus and rectum: Secondary | ICD-10-CM

## 2021-12-10 DIAGNOSIS — E785 Hyperlipidemia, unspecified: Secondary | ICD-10-CM

## 2021-12-10 DIAGNOSIS — E119 Type 2 diabetes mellitus without complications: Secondary | ICD-10-CM | POA: Insufficient documentation

## 2021-12-10 DIAGNOSIS — E1159 Type 2 diabetes mellitus with other circulatory complications: Secondary | ICD-10-CM

## 2021-12-10 DIAGNOSIS — I152 Hypertension secondary to endocrine disorders: Secondary | ICD-10-CM

## 2021-12-10 DIAGNOSIS — R102 Pelvic and perineal pain: Secondary | ICD-10-CM

## 2021-12-10 LAB — POCT GLYCOSYLATED HEMOGLOBIN (HGB A1C): HbA1c, POC (controlled diabetic range): 6.4 % (ref 0.0–7.0)

## 2021-12-10 LAB — POCT URINALYSIS DIP (CLINITEK)
Bilirubin, UA: NEGATIVE
Glucose, UA: NEGATIVE mg/dL
Ketones, POC UA: NEGATIVE mg/dL
Leukocytes, UA: NEGATIVE
Nitrite, UA: NEGATIVE
POC PROTEIN,UA: NEGATIVE
Spec Grav, UA: >=1.03 — AB (ref 1.010–1.025)
Urobilinogen, UA: 0.2 E.U./dL
pH, UA: 7 (ref 5.0–8.0)

## 2021-12-10 MED ORDER — ISOSORBIDE MONONITRATE ER 30 MG PO TB24: 30.0000 mg | ORAL_TABLET | Freq: Every day | ORAL | 1 refills | Status: DC

## 2021-12-10 MED ORDER — ATORVASTATIN CALCIUM 40 MG PO TABS: 40.0000 mg | ORAL_TABLET | Freq: Every day | ORAL | 1 refills | Status: AC

## 2021-12-10 MED ORDER — HYDROCORTISONE (PERIANAL) 2.5 % EX CREA: 1.0000 "application " | TOPICAL_CREAM | Freq: Two times a day (BID) | CUTANEOUS | 2 refills | Status: AC

## 2021-12-10 MED ORDER — METFORMIN HCL ER 500 MG PO TB24: ORAL_TABLET | ORAL | 3 refills | Status: DC

## 2021-12-10 MED ORDER — POLYETHYLENE GLYCOL 3350 17 GM/SCOOP PO POWD: 17.0000 g | Freq: Every day | ORAL | 1 refills | Status: DC

## 2021-12-10 NOTE — Progress Notes (Signed)
Subjective:  Patient ID: Tanya Mason, female    DOB: July 08, 1961  Age: 60 y.o. MRN: [id number]  CC: Abdominal Pain   HPI Tanya Mason is a 60 y.o. year old female with a history of is a 60 year old female with a history of type 2 diabetes mellitus (A1c 6.4), hypertension who presents today to establish care.    Interval History: She complains of headaches which is generalized and Tylenol has been beneficial. It occurs daily and is described as pulsating. She has no blurry vision nausea or headaches and she has no photophobia. She feels a pulse in her head when close to things that are hot. She has pain in her lower abdomen and her rectum. Rectum also feels hot in addition to pain and it is unrelated to bowel movements. Se received a cream at the ED which has been helpful. She endorses presence of constipation.  Lower abdominal pain has been present for couple months and is worse when she urinates.  She denies presence of Neri frequency or flank pain.  Her BP is elevated and she did not take her antihypertensive today. She also has immigration paperwork she would like completed. Past Medical History:  Diagnosis Date   Diabetes 1.5, managed as type 2 ([place])    Diabetes mellitus without complication ([place])    Hypertension     Past Surgical History:  Procedure Laterality Date   ABDOMINAL HYSTERECTOMY     CHOLECYSTECTOMY      Family History  Family history unknown: Yes    Allergies  Allergen Reactions   Aspirin Other (See Comments)   Penicillins Itching and Rash    Outpatient Medications Prior to Visit  Medication Sig Dispense Refill   Multiple Vitamin (MULTIVITAMIN) tablet Take 1 tablet by mouth daily.     hydrocortisone (ANUSOL-HC) 2.5 % rectal cream Place 1 application rectally 2 (two) times daily. 30 g 0   metFORMIN (GLUCOPHAGE-XR) 500 MG 24 hr tablet 2 tabs in the morning and 1 at night 90 tablet 3   atorvastatin (LIPITOR) 40 MG tablet Take  1 tablet (40 mg total) by mouth daily. 90 tablet 1   isosorbide mononitrate (IMDUR) 30 MG 24 hr tablet Take 1 tablet (30 mg total) by mouth daily. 90 tablet 1   sulfamethoxazole-trimethoprim (BACTRIM DS) 800-160 MG tablet Take 1 tablet by mouth 2 (two) times daily. (Patient not taking: Reported on 12/10/2021) 10 tablet 0   No facility-administered medications prior to visit.     ROS Review of Systems  Constitutional:  Negative for activity change, appetite change and fatigue.  HENT:  Negative for congestion, sinus pressure and sore throat.   Eyes:  Negative for visual disturbance.  Respiratory:  Negative for cough, chest tightness, shortness of breath and wheezing.   Cardiovascular:  Negative for chest pain and palpitations.  Gastrointestinal:  Positive for abdominal pain and constipation. Negative for abdominal distention.  Endocrine: Negative for polydipsia.  Genitourinary:  Negative for dysuria and frequency.  Musculoskeletal:  Negative for arthralgias and back pain.  Skin:  Negative for rash.  Neurological:  Positive for headaches. Negative for tremors, light-headedness and numbness.  Hematological:  Does not bruise/bleed easily.  Psychiatric/Behavioral:  Negative for agitation and behavioral problems.    Objective:  BP (!) 163/95    Pulse 80    Ht 5\' 2"  (1.575 m)    Wt 135 lb 3.2 oz (61.3 kg)    LMP  (LMP Unknown)    SpO2 99%  BMI 24.73 kg/m   BP/Weight 12/10/2021 11/28/2021 08/05/2021  Systolic BP 163 155 143  Diastolic BP 95 78 83  Wt. (Lbs) 135.2 - 132  BMI 24.73 - 24.14      Physical Exam Constitutional:      Appearance: She is well-developed.  Cardiovascular:     Rate and Rhythm: Normal rate.     Heart sounds: Normal heart sounds. No murmur heard. Pulmonary:     Effort: Pulmonary effort is normal.     Breath sounds: Normal breath sounds. No wheezing or rales.  Chest:     Chest wall: No tenderness.  Abdominal:     General: Bowel sounds are normal. There is  no distension.     Palpations: Abdomen is soft. There is no mass.     Tenderness: There is no abdominal tenderness. There is no right CVA tenderness or left CVA tenderness.  Musculoskeletal:        General: Normal range of motion.     Right lower leg: No edema.     Left lower leg: No edema.  Neurological:     Mental Status: She is alert and oriented to person, place, and time.  Psychiatric:        Mood and Affect: Mood normal.    CMP Latest Ref Rng & Units 11/28/2021 08/05/2021 04/16/2021  Glucose 70 - 99 mg/dL 983(J) 825(K) 539(J)  BUN 8 - 23 mg/dL 12 9 6(L)  Creatinine 6.73 - 1.00 mg/dL 4.19 3.79 0.24  Sodium 135 - 145 mmol/L 138 139 137  Potassium 3.5 - 5.1 mmol/L 3.9 4.4 4.2  Chloride 98 - 111 mmol/L 101 99 101  CO2 22 - 32 mmol/L 28 26 27   Calcium 8.9 - 10.3 mg/dL 9.5 9.6 9.0  Total Protein 6.0 - 8.5 g/dL - 7.4 -  Total Bilirubin 0.0 - 1.2 mg/dL - 0.2 -  Alkaline Phos 44 - 121 IU/L - 71 -  AST 0 - 40 IU/L - 19 -  ALT 0 - 32 IU/L - 16 -    Lipid Panel     Component Value Date/Time   CHOL 203 (H) 08/05/2021 0952   TRIG 181 (H) 08/05/2021 0952   HDL 57 08/05/2021 0952   CHOLHDL 3.6 08/05/2021 0952   CHOLHDL 4.0 03/31/2021 0631   VLDL 13 03/31/2021 0631   LDLCALC 115 (H) 08/05/2021 0952    CBC    Component Value Date/Time   WBC 8.3 11/28/2021 0148   RBC 4.67 11/28/2021 0148   HGB 12.6 11/28/2021 0148   HGB 14.0 08/05/2021 0952   HCT 39.6 11/28/2021 0148   HCT 43.4 08/05/2021 0952   PLT 345 11/28/2021 0148   PLT 357 08/05/2021 0952   MCV 84.8 11/28/2021 0148   MCV 84 08/05/2021 0952   MCH 27.0 11/28/2021 0148   MCHC 31.8 11/28/2021 0148   RDW 13.1 11/28/2021 0148   RDW 13.2 08/05/2021 0952   LYMPHSABS 3.5 11/28/2021 0148   LYMPHSABS 2.2 08/05/2021 0952   MONOABS 0.5 11/28/2021 0148   EOSABS 0.1 11/28/2021 0148   EOSABS 0.1 08/05/2021 0952   BASOSABS 0.1 11/28/2021 0148   BASOSABS 0.1 08/05/2021 0952    Lab Results  Component Value Date   HGBA1C 6.4  12/10/2021    Assessment & Plan:  1. Type 2 diabetes mellitus with other specified complication, without long-term current use of insulin (HCC) Controlled with A1c of 6.4 Continue current regimen - POCT glycosylated hemoglobin (Hb A1C)  2. Hyperlipidemia associated with type  2 diabetes mellitus ([place]) Uncontrolled Continue statin and will repeat at a subsequent visit when she is fasting Low-cholesterol diet - atorvastatin (LIPITOR) 40 MG tablet; Take 1 tablet (40 mg total) by mouth daily.  Dispense: 90 tablet; Refill: 1  3. Other constipation Increase fiber intake - polyethylene glycol powder (GLYCOLAX/MIRALAX) 17 GM/SCOOP powder; Take 17 g by mouth daily.  Dispense: 3350 g; Refill: 1  4. Hypertension associated with diabetes ([place]) Uncontrolled She is yet to take her antihypertensive Compliance has been emphasized - isosorbide mononitrate (IMDUR) 30 MG 24 hr tablet; Take 1 tablet (30 mg total) by mouth daily.  Dispense: 90 tablet; Refill: 1 - metFORMIN (GLUCOPHAGE-XR) 500 MG 24 hr tablet; 2 tabs in the morning and 1 at night  Dispense: 90 tablet; Refill: 3  5. Chronic tension-type headache, not intractable Symptoms are not in keeping with migraine She does have improvement with use of Tylenol and can continue that  6. Pelvic pain Urinalysis negative for UTI but reveals presence of moderate blood Will order pelvic ultrasound at next visit if symptoms persist - POCT URINALYSIS DIP (CLINITEK)  7. Other microscopic hematuria See #6 above She has no flank pain or symptoms of renal colic hence low suspicion for renal calculi Will repeat UA at next visit and work-up by means of pelvic ultrasound or renal stone protocol at next visit if symptoms persist  8. Rectal pain Likely has hemorrhoids as a result of chronic constipation - hydrocortisone (ANUSOL-HC) 2.5 % rectal cream; Place 1 application rectally 2 (two) times daily.  Dispense: 30 g; Refill: 2  Advised that unfortunately I  am unable to complete her paperwork today as this is her first encounter with me and she does have a lot of other medical concerns she would like to address.  We can review the paperwork at her next visit.  Meds ordered this encounter  Medications   hydrocortisone (ANUSOL-HC) 2.5 % rectal cream    Sig: Place 1 application rectally 2 (two) times daily.    Dispense:  30 g    Refill:  2   isosorbide mononitrate (IMDUR) 30 MG 24 hr tablet    Sig: Take 1 tablet (30 mg total) by mouth daily.    Dispense:  90 tablet    Refill:  1   metFORMIN (GLUCOPHAGE-XR) 500 MG 24 hr tablet    Sig: 2 tabs in the morning and 1 at night    Dispense:  90 tablet    Refill:  3   atorvastatin (LIPITOR) 40 MG tablet    Sig: Take 1 tablet (40 mg total) by mouth daily.    Dispense:  90 tablet    Refill:  1   polyethylene glycol powder (GLYCOLAX/MIRALAX) 17 GM/SCOOP powder    Sig: Take 17 g by mouth daily.    Dispense:  3350 g    Refill:  1    Follow-up: Return in about 1 month (around 01/10/2022) for Follow-up of hematuria and completion of paperwork.       Charlott Rakes, MD, FAAFP. [facility] and [place], [place]   12/10/2021, 10:08 AM

## 2021-12-10 NOTE — Progress Notes (Signed)
Having pain in lower abdomen.  Headaches.  Not taking Atorvastatin.

## 2022-01-11 ENCOUNTER — Other Ambulatory Visit: Payer: Self-pay

## 2022-01-11 ENCOUNTER — Encounter: Payer: Self-pay | Admitting: Family Medicine

## 2022-01-11 ENCOUNTER — Ambulatory Visit: Payer: 59 | Attending: Family Medicine | Admitting: Family Medicine

## 2022-01-11 VITALS — BP 116/81 | HR 103 | Ht 62.0 in | Wt 131.6 lb

## 2022-01-11 DIAGNOSIS — E1169 Type 2 diabetes mellitus with other specified complication: Secondary | ICD-10-CM | POA: Diagnosis not present

## 2022-01-11 DIAGNOSIS — R3129 Other microscopic hematuria: Secondary | ICD-10-CM

## 2022-01-11 DIAGNOSIS — N3001 Acute cystitis with hematuria: Secondary | ICD-10-CM

## 2022-01-11 DIAGNOSIS — E1159 Type 2 diabetes mellitus with other circulatory complications: Secondary | ICD-10-CM | POA: Diagnosis not present

## 2022-01-11 DIAGNOSIS — I152 Hypertension secondary to endocrine disorders: Secondary | ICD-10-CM

## 2022-01-11 LAB — POCT URINALYSIS DIP (CLINITEK)
Bilirubin, UA: NEGATIVE
Glucose, UA: NEGATIVE mg/dL
Ketones, POC UA: NEGATIVE mg/dL
Nitrite, UA: NEGATIVE
POC PROTEIN,UA: NEGATIVE
Spec Grav, UA: 1.01 (ref 1.010–1.025)
Urobilinogen, UA: 0.2 E.U./dL
pH, UA: 7 (ref 5.0–8.0)

## 2022-01-11 MED ORDER — DAPAGLIFLOZIN PROPANEDIOL 5 MG PO TABS
5.0000 mg | ORAL_TABLET | Freq: Every day | ORAL | 1 refills | Status: DC
  Filled 2022-01-11: qty 30, 30d supply, fill #0

## 2022-01-11 MED ORDER — AMLODIPINE BESYLATE 5 MG PO TABS
5.0000 mg | ORAL_TABLET | Freq: Every day | ORAL | 1 refills | Status: DC
  Filled 2022-01-11: qty 30, 30d supply, fill #0

## 2022-01-11 MED ORDER — AMLODIPINE BESYLATE 5 MG PO TABS
5.0000 mg | ORAL_TABLET | Freq: Every day | ORAL | 1 refills | Status: DC
  Filled 2022-01-11: qty 90, 90d supply, fill #0

## 2022-01-11 MED ORDER — NITROFURANTOIN MONOHYD MACRO 100 MG PO CAPS
100.0000 mg | ORAL_CAPSULE | Freq: Two times a day (BID) | ORAL | 0 refills | Status: DC
  Filled 2022-01-11: qty 10, 5d supply, fill #0

## 2022-01-11 NOTE — Progress Notes (Signed)
Still having abdominal pain States she gets the pain after taking her Metformin.

## 2022-01-11 NOTE — Progress Notes (Signed)
Subjective:  Patient ID: Tanya Mason, female    DOB: 20-Dec-1961  Age: 61 y.o. MRN: [id number]  CC: Abdominal Pain   HPI Tanya Mason is a 61 y.o. year old female with a history of type 2 diabetes mellitus (A1c 6.4), hypertension.  Interval History: She complains of abdominal pain when she takes Metformin and her 'blood pressure pill'. She states the medications cause her Colon to be irritated and would like to be changed to something else. At her last office visit she did have moderate hematuria and the plan was to repeat her urinalysis at this visit.  She does not have macroscopic hematuria. She brings in [place] Citizenship exam exemption form.  The medical condition she gives for exemption is the fact that she is fatigued, has abdominal pain and has diabetes mellitus.  States her lawyer advised her to get this to her doctor.  I have explained to her that unfortunately her medical conditions do not count as the disability to warrant exemption. Past Medical History:  Diagnosis Date   Diabetes 1.5, managed as type 2 ([place])    Diabetes mellitus without complication ([place])    Hypertension     Past Surgical History:  Procedure Laterality Date   ABDOMINAL HYSTERECTOMY     CHOLECYSTECTOMY      Family History  Family history unknown: Yes    Allergies  Allergen Reactions   Aspirin Other (See Comments)   Penicillins Itching and Rash    Outpatient Medications Prior to Visit  Medication Sig Dispense Refill   atorvastatin (LIPITOR) 40 MG tablet Take 1 tablet (40 mg total) by mouth daily. 90 tablet 1   hydrocortisone (ANUSOL-HC) 2.5 % rectal cream Place 1 application rectally 2 (two) times daily. 30 g 2   Multiple Vitamin (MULTIVITAMIN) tablet Take 1 tablet by mouth daily.     polyethylene glycol powder (GLYCOLAX/MIRALAX) 17 GM/SCOOP powder Take 17 g by mouth daily. 3350 g 1   metFORMIN (GLUCOPHAGE-XR) 500 MG 24 hr tablet 2 tabs in the morning and 1 at night  90 tablet 3   isosorbide mononitrate (IMDUR) 30 MG 24 hr tablet Take 1 tablet (30 mg total) by mouth daily. 90 tablet 1   No facility-administered medications prior to visit.     ROS Review of Systems  Constitutional:  Negative for activity change, appetite change and fatigue.  HENT:  Negative for congestion, sinus pressure and sore throat.   Eyes:  Negative for visual disturbance.  Respiratory:  Negative for cough, chest tightness, shortness of breath and wheezing.   Cardiovascular:  Negative for chest pain and palpitations.  Gastrointestinal:  Positive for abdominal pain. Negative for abdominal distention and constipation.  Endocrine: Negative for polydipsia.  Genitourinary:  Negative for dysuria and frequency.  Musculoskeletal:  Negative for arthralgias and back pain.  Skin:  Negative for rash.  Neurological:  Negative for tremors, light-headedness and numbness.  Hematological:  Does not bruise/bleed easily.  Psychiatric/Behavioral:  Negative for agitation and behavioral problems.    Objective:  BP 116/81    Pulse (!) 103    Ht 5\' 2"  (1.575 m)    Wt 131 lb 9.6 oz (59.7 kg)    LMP  (LMP Unknown)    SpO2 100%    BMI 24.07 kg/m   BP/Weight 01/11/2022 12/10/2021 [id number]  Systolic BP [id number]  Diastolic BP 81 95 78  Wt. (Lbs) 131.6 135.2 -  BMI 24.07 24.73 -      Physical  Exam Constitutional:      Appearance: She is well-developed.  Cardiovascular:     Rate and Rhythm: Tachycardia present.     Heart sounds: Normal heart sounds. No murmur heard. Pulmonary:     Effort: Pulmonary effort is normal.     Breath sounds: Normal breath sounds. No wheezing or rales.  Chest:     Chest wall: No tenderness.  Abdominal:     General: Bowel sounds are normal. There is no distension.     Palpations: Abdomen is soft. There is no mass.     Tenderness: There is no abdominal tenderness.  Musculoskeletal:        General: Normal range of motion.     Right lower leg: No edema.      Left lower leg: No edema.  Neurological:     Mental Status: She is alert and oriented to person, place, and time.  Psychiatric:        Mood and Affect: Mood normal.    CMP Latest Ref Rng & Units 11/28/2021 08/05/2021 04/16/2021  Glucose 70 - 99 mg/dL 118(H) 121(H) 148(H)  BUN 8 - 23 mg/dL 12 9 6(L)  Creatinine 0.44 - 1.00 mg/dL 0.63 0.72 0.68  Sodium 135 - 145 mmol/L 138 139 137  Potassium 3.5 - 5.1 mmol/L 3.9 4.4 4.2  Chloride 98 - 111 mmol/L 101 99 101  CO2 22 - 32 mmol/L 28 26 27   Calcium 8.9 - 10.3 mg/dL 9.5 9.6 9.0  Total Protein 6.0 - 8.5 g/dL - 7.4 -  Total Bilirubin 0.0 - 1.2 mg/dL - 0.2 -  Alkaline Phos 44 - 121 IU/L - 71 -  AST 0 - 40 IU/L - 19 -  ALT 0 - 32 IU/L - 16 -    Lipid Panel     Component Value Date/Time   CHOL 203 (H) 08/05/2021 0952   TRIG 181 (H) 08/05/2021 0952   HDL 57 08/05/2021 0952   CHOLHDL 3.6 08/05/2021 0952   CHOLHDL 4.0 03/31/2021 0631   VLDL 13 03/31/2021 0631   LDLCALC 115 (H) 08/05/2021 0952    CBC    Component Value Date/Time   WBC 8.3 11/28/2021 0148   RBC 4.67 11/28/2021 0148   HGB 12.6 11/28/2021 0148   HGB 14.0 08/05/2021 0952   HCT 39.6 11/28/2021 0148   HCT 43.4 08/05/2021 0952   PLT 345 11/28/2021 0148   PLT 357 08/05/2021 0952   MCV 84.8 11/28/2021 0148   MCV 84 08/05/2021 0952   MCH 27.0 11/28/2021 0148   MCHC 31.8 11/28/2021 0148   RDW 13.1 11/28/2021 0148   RDW 13.2 08/05/2021 0952   LYMPHSABS 3.5 11/28/2021 0148   LYMPHSABS 2.2 08/05/2021 0952   MONOABS 0.5 11/28/2021 0148   EOSABS 0.1 11/28/2021 0148   EOSABS 0.1 08/05/2021 0952   BASOSABS 0.1 11/28/2021 0148   BASOSABS 0.1 08/05/2021 0952    Lab Results  Component Value Date   HGBA1C 6.4 12/10/2021    Assessment & Plan:  1. Other microscopic hematuria She previously had moderate hematuria but today has trace hematuria This can be attributed this to her UTI If symptoms persist consider renal calculi work-up - POCT URINALYSIS DIP (CLINITEK) - Urine  Culture  2. Acute cystitis with hematuria This could explain her abdominal pain - nitrofurantoin, macrocrystal-monohydrate, (MACROBID) 100 MG capsule; Take 1 capsule (100 mg total) by mouth 2 (two) times daily.  Dispense: 10 capsule; Refill: 0  3. Type 2 diabetes mellitus with other specified complication,  without long-term current use of insulin (HCC) Controlled with A1c of 6.4 but given presence of abdominal pain on metformin I have switched to [place] - dapagliflozin propanediol (FARXIGA) 5 MG TABS tablet; Take 1 tablet (5 mg total) by mouth daily before breakfast.  Dispense: 90 tablet; Refill: 1  4. Hypertension associated with diabetes ([place]) Controlled She is of the opinion that isosorbide could be causing her abdominal pain I have discontinued isosorbide and placed her on amlodipine Counseled on blood pressure goal of less than 130/80, low-sodium, DASH diet, medication compliance, 150 minutes of moderate intensity exercise per week. Discussed medication compliance, adverse effects. - amLODipine (NORVASC) 5 MG tablet; Take 1 tablet (5 mg total) by mouth daily.  Dispense: 90 tablet; Refill: 1    Meds ordered this encounter  Medications   dapagliflozin propanediol (FARXIGA) 5 MG TABS tablet    Sig: Take 1 tablet (5 mg total) by mouth daily before breakfast.    Dispense:  90 tablet    Refill:  1    Discontinue Metformin   DISCONTD: amLODipine (NORVASC) 5 MG tablet    Sig: Take 1 tablet (5 mg total) by mouth daily.    Dispense:  90 tablet    Refill:  1   nitrofurantoin, macrocrystal-monohydrate, (MACROBID) 100 MG capsule    Sig: Take 1 capsule (100 mg total) by mouth 2 (two) times daily.    Dispense:  10 capsule    Refill:  0   amLODipine (NORVASC) 5 MG tablet    Sig: Take 1 tablet (5 mg total) by mouth daily.    Dispense:  90 tablet    Refill:  1    Discontinue isosorbide    Follow-up: Return in about 3 months (around 04/11/2022) for Chronic medical conditions.        Charlott Rakes, MD, FAAFP. [facility] and [place], [place]   01/11/2022, 12:05 PM

## 2022-01-13 ENCOUNTER — Other Ambulatory Visit: Payer: Self-pay

## 2022-01-14 ENCOUNTER — Other Ambulatory Visit: Payer: Self-pay

## 2022-01-14 ENCOUNTER — Other Ambulatory Visit: Payer: Self-pay | Admitting: Family Medicine

## 2022-01-14 LAB — URINE CULTURE

## 2022-01-14 MED ORDER — CIPROFLOXACIN HCL 500 MG PO TABS
500.0000 mg | ORAL_TABLET | Freq: Two times a day (BID) | ORAL | 0 refills | Status: AC
  Filled 2022-01-14: qty 6, 3d supply, fill #0

## 2022-01-15 ENCOUNTER — Other Ambulatory Visit: Payer: Self-pay | Admitting: Family Medicine

## 2022-01-15 ENCOUNTER — Other Ambulatory Visit: Payer: Self-pay

## 2022-01-15 DIAGNOSIS — I152 Hypertension secondary to endocrine disorders: Secondary | ICD-10-CM

## 2022-01-15 NOTE — Telephone Encounter (Signed)
Discontinued 01/11/22 Requested Prescriptions  Refused Prescriptions Disp Refills   metFORMIN (GLUCOPHAGE-XR) 500 MG 24 hr tablet 90 tablet 3    Sig: 2 tabs in the morning and 1 at night     Endocrinology:  Diabetes - Biguanides Passed - 01/15/2022 11:41 AM      Passed - Cr in normal range and within 360 days    Creatinine, Ser  Date Value Ref Range Status  11/28/2021 0.63 0.44 - 1.00 mg/dL Final         Passed - HBA1C is between 0 and 7.9 and within 180 days    HbA1c, POC (controlled diabetic range)  Date Value Ref Range Status  12/10/2021 6.4 0.0 - 7.0 % Final         Passed - eGFR in normal range and within 360 days    GFR calc Af Amer  Date Value Ref Range Status  09/22/2020 >60 >60 mL/min Final   GFR, Estimated  Date Value Ref Range Status  11/28/2021 >60 >60 mL/min Final    Comment:    (NOTE) Calculated using the CKD-EPI Creatinine Equation (2021)    eGFR  Date Value Ref Range Status  08/05/2021 94 >59 mL/min/1.73 Final         Passed - Valid encounter within last 6 months    Recent Outpatient Visits          4 days ago Other microscopic hematuria   [place], [place], MD   1 month ago Hyperlipidemia associated with type 2 diabetes mellitus ([place])   [place], [place], [place]   5 months ago Type 2 diabetes mellitus with hyperglycemia, unspecified whether long term insulin use [place])   [place], [place], [place]   8 months ago Type 2 diabetes mellitus with hyperglycemia, unspecified whether long term insulin use [facility])   [place], [place]ne [place], [place]      Future Appointments            In 2 months Charlott Rakes, MD [place]

## 2022-01-15 NOTE — Telephone Encounter (Signed)
Using [facility] Clydie Braun ON#[id number]. Patient called to discuss her concerns. She says she doesn't want to take the [place] because of the side effects and she already has stomach and heart problems. She says she wants to go back on the Metformin and for it to be sent to the [facility] close to her home on [address]. I advised I will send this to Dr. Alvis Lemmings and if there are any recommendations someone will call back to discuss. Interpreter will be needed.  Preferred Pharmacy:  [facility] [phone] - [place], [place] - [address] AT [address] Phone:  [phone]  Fax:  [phone]

## 2022-01-15 NOTE — Telephone Encounter (Signed)
Pt is calling back requesting an update for the refill request for medication metFORMIN (GLUCOPHAGE-XR) 500 MG 24 hr tablet. Pt stated she was unaware the PCP was changing her medication and would like to discuss .   Pt stated that the new medication will have an effect on the problems she already has with her heart and stomach.   Pt requesting spanish interpreter for call back.

## 2022-01-18 ENCOUNTER — Telehealth: Payer: Self-pay | Admitting: Pharmacist

## 2022-01-18 ENCOUNTER — Other Ambulatory Visit (HOSPITAL_COMMUNITY): Payer: Self-pay

## 2022-01-18 ENCOUNTER — Ambulatory Visit: Payer: Self-pay | Admitting: *Deleted

## 2022-01-18 NOTE — Telephone Encounter (Signed)
Summary: medication refill//unsure of the name of the medication   Pt stated she needs a medication refill for her heart medication. Pt stated she is unsure of the name of the medication.   has at least one month's supply left and is concerned about running out.   Pt stated is a small yellow pill with a heart on it.   Needs spanish interpreter.      Chief Complaint: medication RF- ASA 81 mg Symptoms: no Frequency: daily dose Pertinent Negatives: Patient denies   Disposition: [] ED /[] Urgent Care (no appt availability in office) / [] Appointment(In office/virtual)/ []  [place] [facility]/ [] Home Care/ [] Refused Recommended Disposition /[] [place] [facility]/ []  Follow-up with PCP Additional Notes: Patient would like Rx for ASA 81 mg- she wants to continue. Patient feels if she does not take this she will have heart attack. Request Rx- [facility] / [address] Interpreter: [phone] Answer Assessment - Initial Assessment Questions 1. DRUG NAME: "What medicine do you need to have refilled?"     Patient is not sure of the name 2. REFILLS REMAINING: "How many refills are remaining?" (Note: The label on the medicine or pill bottle will show how many refills are remaining. If there are no refills remaining, then a renewal may be needed.)     none 3. EXPIRATION DATE: "What is the expiration date?" (Note: The label states when the prescription will expire, and thus can no longer be refilled.)      4. PRESCRIBING HCP: "Who prescribed it?" Reason: If prescribed by specialist, call should be referred to that group.     ED provider Call to pharmacy- the only yellow pill with heart on it is 81 mg ASA- patient did get 360 last April. Will send message to PCP to get approval for patient to continue since ASA is not on her current medication list.  Protocols used: Medication Refill and Renewal Call-A-AH

## 2022-01-18 NOTE — Telephone Encounter (Signed)
Unable to access triage nurse note. Pt is requesting refill for aspirin but it appears she has an allergy listed to aspirin. However, she has been taking the aspirin with no reported side effects or symptoms concerning for allergy. Will route message to PCP for her to be aware.

## 2022-01-19 NOTE — Telephone Encounter (Signed)
Her chart reveals allergy to aspirin.  Can we please confirm this allergy with the patient so that we can delete it if it is not accurate and then I can prescribe the aspirin?  Thank you

## 2022-01-22 NOTE — Telephone Encounter (Signed)
Call placed to patient x3 with no response. Left HIPAA-compliant VM with instructions to call back so we can verify her request.

## 2022-04-13 ENCOUNTER — Ambulatory Visit: Payer: 59 | Admitting: Family Medicine

## 2022-08-31 ENCOUNTER — Other Ambulatory Visit: Payer: Self-pay | Admitting: Physician Assistant

## 2022-08-31 DIAGNOSIS — E1165 Type 2 diabetes mellitus with hyperglycemia: Secondary | ICD-10-CM

## 2022-09-02 ENCOUNTER — Other Ambulatory Visit: Payer: Self-pay | Admitting: Family Medicine

## 2022-09-02 DIAGNOSIS — E1159 Type 2 diabetes mellitus with other circulatory complications: Secondary | ICD-10-CM

## 2022-11-05 ENCOUNTER — Encounter (HOSPITAL_COMMUNITY): Payer: Self-pay

## 2022-11-05 ENCOUNTER — Emergency Department (HOSPITAL_COMMUNITY)
Admission: EM | Admit: 2022-11-05 | Discharge: 2022-11-06 | Disposition: A | Discharge status: Home / Self Care | Payer: Commercial Managed Care - HMO | Attending: Emergency Medicine | Admitting: Emergency Medicine

## 2022-11-05 ENCOUNTER — Other Ambulatory Visit: Payer: Self-pay

## 2022-11-05 DIAGNOSIS — J029 Acute pharyngitis, unspecified: Secondary | ICD-10-CM | POA: Diagnosis not present

## 2022-11-05 DIAGNOSIS — Z79899 Other long term (current) drug therapy: Secondary | ICD-10-CM | POA: Diagnosis not present

## 2022-11-05 DIAGNOSIS — R1013 Epigastric pain: Secondary | ICD-10-CM | POA: Diagnosis not present

## 2022-11-05 DIAGNOSIS — I10 Essential (primary) hypertension: Secondary | ICD-10-CM | POA: Diagnosis not present

## 2022-11-05 DIAGNOSIS — E119 Type 2 diabetes mellitus without complications: Secondary | ICD-10-CM | POA: Insufficient documentation

## 2022-11-05 DIAGNOSIS — R059 Cough, unspecified: Secondary | ICD-10-CM | POA: Diagnosis not present

## 2022-11-05 DIAGNOSIS — R109 Unspecified abdominal pain: Secondary | ICD-10-CM | POA: Diagnosis present

## 2022-11-05 LAB — COMPREHENSIVE METABOLIC PANEL
ALT: 24 U/L (ref 0–44)
AST: 27 U/L (ref 15–41)
Albumin: 3.8 g/dL (ref 3.5–5.0)
Alkaline Phosphatase: 58 U/L (ref 38–126)
Anion gap: 10 (ref 5–15)
BUN: 8 mg/dL (ref 8–23)
CO2: 27 mmol/L (ref 22–32)
Calcium: 9.1 mg/dL (ref 8.9–10.3)
Chloride: 100 mmol/L (ref 98–111)
Creatinine, Ser: 0.76 mg/dL (ref 0.44–1.00)
GFR, Estimated: >60 mL/min (ref >60)
Glucose, Bld: 142 mg/dL — ABNORMAL HIGH (ref 70–99)
Potassium: 3.5 mmol/L (ref 3.5–5.1)
Sodium: 137 mmol/L (ref 135–145)
Total Bilirubin: 0.2 mg/dL — ABNORMAL LOW (ref 0.3–1.2)
Total Protein: 7 g/dL (ref 6.5–8.1)

## 2022-11-05 LAB — CBC WITH DIFFERENTIAL/PLATELET
Abs Immature Granulocytes: 0.02 10*3/uL (ref 0.00–0.07)
Basophils Absolute: 0.1 10*3/uL (ref 0.0–0.1)
Basophils Relative: 1 %
Eosinophils Absolute: 0.1 10*3/uL (ref 0.0–0.5)
Eosinophils Relative: 1 %
HCT: 36.1 % (ref 36.0–46.0)
Hemoglobin: 11.6 g/dL — ABNORMAL LOW (ref 12.0–15.0)
Immature Granulocytes: 0 %
Lymphocytes Relative: 45 %
Lymphs Abs: 3.9 10*3/uL (ref 0.7–4.0)
MCH: 27.2 pg (ref 26.0–34.0)
MCHC: 32.1 g/dL (ref 30.0–36.0)
MCV: 84.7 fL (ref 80.0–100.0)
Monocytes Absolute: 0.4 10*3/uL (ref 0.1–1.0)
Monocytes Relative: 4 %
Neutro Abs: 4.3 10*3/uL (ref 1.7–7.7)
Neutrophils Relative %: 49 %
Platelets: 324 10*3/uL (ref 150–400)
RBC: 4.26 MIL/uL (ref 3.87–5.11)
RDW: 13.4 % (ref 11.5–15.5)
WBC: 8.7 10*3/uL (ref 4.0–10.5)
nRBC: 0 % (ref 0.0–0.2)

## 2022-11-05 LAB — URINALYSIS, ROUTINE W REFLEX MICROSCOPIC
Bacteria, UA: NONE SEEN
Bilirubin Urine: NEGATIVE
Glucose, UA: NEGATIVE mg/dL
Ketones, ur: NEGATIVE mg/dL
Nitrite: NEGATIVE
Protein, ur: NEGATIVE mg/dL
Specific Gravity, Urine: 1.002 — ABNORMAL LOW (ref 1.005–1.030)
pH: 7 (ref 5.0–8.0)

## 2022-11-05 LAB — LIPASE, BLOOD: Lipase: 46 U/L (ref 11–51)

## 2022-11-05 NOTE — ED Triage Notes (Signed)
Triage done using ipad spanish interpreter: Pt reports abd pain, states she feels swollen. She states this happens after she eats, she gets dizzy and feels like she has to vomit. Symptom onset 2 weeks ago

## 2022-11-05 NOTE — ED Provider Triage Note (Signed)
Emergency Medicine Provider Triage Evaluation Note  Tomasa Dobransky , a 61 y.o. female  was evaluated in triage.  Pt complains of stomach bloating and pain for the past 2 weeks.  She states she saw her doctor who put her on a PPI, but her symptoms are worse.  She states she has nausea and dizziness in the mornings.  Her abdominal pain is worse after eating which makes her feel like she has to cough.  She states the pain is mainly in the epigastric region.  Denies shortness of breath, diarrhea.  Review of Systems  Positive: See above Negative: See above  Physical Exam  BP (!) 145/84   Pulse 83   Temp 97.8 F (36.6 C) (Oral)   Resp 16   LMP  (LMP Unknown)   SpO2 98%  Gen:   Awake, no distress   Resp:  Normal effort  MSK:   Moves extremities without difficulty  Other:  Tenderness to epigastric region on palpation, bowel sounds normal, abdomen is soft  Medical Decision Making  Medically screening exam initiated at 7:50 PM.  Appropriate orders placed.  [name] was informed that the remainder of the evaluation will be completed by another provider, this initial triage assessment does not replace that evaluation, and the importance of remaining in the ED until their evaluation is complete.     Melton Alar R, [place] 11/05/22 [phone]

## 2022-11-06 LAB — CBG MONITORING, ED: Glucose-Capillary: 102 mg/dL — ABNORMAL HIGH (ref 70–99)

## 2022-11-06 MED ORDER — PANTOPRAZOLE SODIUM 20 MG PO TBEC: 20.0000 mg | DELAYED_RELEASE_TABLET | Freq: Two times a day (BID) | ORAL | 0 refills | Status: DC

## 2022-11-06 MED ORDER — ALUM & MAG HYDROXIDE-SIMETH 200-200-20 MG/5ML PO SUSP
30.0000 mL | Freq: Once | ORAL | Status: AC
  Administered 2022-11-06: 30 mL via ORAL
  Filled 2022-11-06: qty 30

## 2022-11-06 MED ORDER — PANTOPRAZOLE SODIUM 40 MG PO TBEC
40.0000 mg | DELAYED_RELEASE_TABLET | Freq: Every day | ORAL | Status: DC
  Administered 2022-11-06: 40 mg via ORAL
  Filled 2022-11-06: qty 1

## 2022-11-06 MED ORDER — SUCRALFATE 1 G PO TABS: 1.0000 g | ORAL_TABLET | Freq: Three times a day (TID) | ORAL | 0 refills | Status: DC

## 2022-11-06 MED ORDER — ONDANSETRON HCL 4 MG PO TABS: 4.0000 mg | ORAL_TABLET | Freq: Three times a day (TID) | ORAL | 0 refills | Status: DC | PRN

## 2022-11-06 NOTE — Discharge Instructions (Signed)
Take pantoprazole, Carafate, and Zofran as needed.  Follow-up with gastroenterology.

## 2022-11-06 NOTE — ED Provider Notes (Signed)
[facility] EMERGENCY DEPARTMENT Provider Note   CSN: [id number] Arrival date & time: 11/05/22  1857     History  Chief Complaint  Patient presents with   Abdominal Pain    Tanya Mason is a 61 y.o. female.  HPI     History obtained with interpreter.  This is a 61 year old female who presents with abdominal pain, sore throat and cough after eating.  She has been experiencing worsening symptoms over the last 2 weeks.  She has a history of reflux but is no longer taking medications.  She did see her doctor who started her on omeprazole.  She took the omeprazole for 2 days and thought it made her symptoms worse.  She reports nausea and abdominal discomfort after eating.  She describes burning in her esophagus and feeling like she has to cough.  Symptoms are worse at night.  She denies any NSAID use or alcohol use.  Home Medications Prior to Admission medications   Medication Sig Start Date End Date Taking? Authorizing Provider  amLODipine (NORVASC) 5 MG tablet Take 1 tablet (5 mg total) by mouth daily. 01/11/22   Hoy Register, MD  atorvastatin (LIPITOR) 40 MG tablet Take 1 tablet (40 mg total) by mouth daily. 12/10/21 03/10/22  Hoy Register, MD  dapagliflozin propanediol (FARXIGA) 5 MG TABS tablet Take 1 tablet (5 mg total) by mouth daily before breakfast. 01/11/22   Hoy Register, MD  hydrocortisone (ANUSOL-HC) 2.5 % rectal cream Place 1 application rectally 2 (two) times daily. 12/10/21   Hoy Register, MD  Multiple Vitamin (MULTIVITAMIN) tablet Take 1 tablet by mouth daily.    [provider]  nitrofurantoin, macrocrystal-monohydrate, (MACROBID) 100 MG capsule Take 1 capsule (100 mg total) by mouth 2 (two) times daily. 01/11/22   Hoy Register, MD  polyethylene glycol powder (GLYCOLAX/MIRALAX) 17 GM/SCOOP powder Take 17 g by mouth daily. 12/10/21   Hoy Register, MD      Allergies    Aspirin and Penicillins    Review of  Systems   Review of Systems  Constitutional:  Negative for fever.  Respiratory:  Negative for shortness of breath.   Cardiovascular:  Negative for chest pain.  Gastrointestinal:  Positive for abdominal pain and nausea. Negative for constipation and vomiting.  All other systems reviewed and are negative.   Physical Exam Updated Vital Signs BP 130/71 (BP Location: Right Arm)   Pulse 72   Temp 98.3 F (36.8 C) (Oral)   Resp 17   Wt 61.2 kg   LMP  (LMP Unknown)   SpO2 97%   BMI 24.69 kg/m  Physical Exam Vitals and nursing note reviewed.  Constitutional:      Appearance: She is well-developed. She is not ill-appearing.  HENT:     Head: Normocephalic and atraumatic.  Eyes:     Pupils: Pupils are equal, round, and reactive to light.  Cardiovascular:     Rate and Rhythm: Normal rate and regular rhythm.     Heart sounds: Normal heart sounds.  Pulmonary:     Effort: Pulmonary effort is normal. No respiratory distress.     Breath sounds: No wheezing.  Abdominal:     General: Bowel sounds are normal.     Palpations: Abdomen is soft.     Tenderness: There is abdominal tenderness in the epigastric area. There is no guarding or rebound.  Musculoskeletal:     Cervical back: Neck supple.  Skin:    General: Skin is warm and dry.  Neurological:     Mental Status: She is alert and oriented to person, place, and time.  Psychiatric:        Mood and Affect: Mood normal.     ED Results / Procedures / Treatments   Labs (all labs ordered are listed, but only abnormal results are displayed) Labs Reviewed  COMPREHENSIVE METABOLIC PANEL - Abnormal; Notable for the following components:      Result Value   Glucose, Bld 142 (*)    Total Bilirubin 0.2 (*)    All other components within normal limits  URINALYSIS, ROUTINE W REFLEX MICROSCOPIC - Abnormal; Notable for the following components:   Color, Urine COLORLESS (*)    Specific Gravity, Urine 1.002 (*)    Hgb urine dipstick MODERATE  (*)    Leukocytes,Ua TRACE (*)    All other components within normal limits  CBC WITH DIFFERENTIAL/PLATELET - Abnormal; Notable for the following components:   Hemoglobin 11.6 (*)    All other components within normal limits  CBG MONITORING, ED - Abnormal; Notable for the following components:   Glucose-Capillary 102 (*)    All other components within normal limits  LIPASE, BLOOD    EKG None  Radiology No results found.  Procedures Procedures    Medications Ordered in ED Medications  alum & mag hydroxide-simeth (MAALOX/MYLANTA) 200-200-20 MG/5ML suspension 30 mL (has no administration in time range)  pantoprazole (PROTONIX) EC tablet 40 mg (has no administration in time range)    ED Course/ Medical Decision Making/ A&P                           Medical Decision Making Risk OTC drugs. Prescription drug management.   This patient presents to the ED for concern of gastric pain, this involves an extensive number of treatment options, and is a complaint that carries with it a high risk of complications and morbidity.  I considered the following differential and admission for this acute, potentially life threatening condition.  The differential diagnosis includes gastritis, peptic ulcer, H. pylori, esophagitis, pancreatitis, cholecystitis  MDM:    This is a 61 year old female who presents with epigastric pain.  She describes burning in her esophagus.  Most of her symptoms are postprandial.  She is overall nontoxic and vital signs are reassuring.  She has some tenderness in the epigastrium on exam.  I highly suspect gastritis versus peptic ulcer disease.  Her labs were obtained and reviewed.  No significant metabolic derangements.  LFTs and lipase are normal which would argue against cholecystitis or pancreatitis.  No leukocytosis.  She is reluctant to continue omeprazole as she feels that this is making her symptoms worse.  I discussed with her that these medications sometimes  takes several days to take full effect.  She would like to trial a different PPI.  We will start on Protonix.  She was given a GI cocktail and Protonix here in the emergency department.  Recommend gastroenterology follow-up.  Do not feel she needs advanced imaging at this time.  (Labs, imaging, consults)  Labs: I Ordered, and personally interpreted labs.  The pertinent results include: CBC, CMP, lipase  Imaging Studies ordered: I ordered imaging studies including none I independently visualized and interpreted imaging. I agree with the radiologist interpretation  Additional history obtained from family at bedside.  External records from outside source obtained and reviewed including prior evaluations  Cardiac Monitoring: The patient was maintained on a cardiac monitor.  I personally  viewed and interpreted the cardiac monitored which showed an underlying rhythm of: Normal sinus rhythm  Reevaluation: After the interventions noted above, I reevaluated the patient and found that they have :improved  Social Determinants of Health:  lives independently  Disposition: Discharge  Co morbidities that complicate the patient evaluation  Past Medical History:  Diagnosis Date   Diabetes 1.5, managed as type 2 (HCC)    Diabetes mellitus without complication (HCC)    Hypertension      Medicines Meds ordered this encounter  Medications   alum & mag hydroxide-simeth (MAALOX/MYLANTA) 200-200-20 MG/5ML suspension 30 mL   pantoprazole (PROTONIX) EC tablet 40 mg    I have reviewed the patients home medicines and have made adjustments as needed  Problem List / ED Course: Problem List Items Addressed This Visit   None Visit Diagnoses     Epigastric pain    -  Primary                   Final Clinical Impression(s) / ED Diagnoses Final diagnoses:  Epigastric pain    Rx / DC Orders ED Discharge Orders     None         Shon Baton, MD 11/06/22 [phone]

## 2022-11-17 DIAGNOSIS — R1013 Epigastric pain: Secondary | ICD-10-CM | POA: Insufficient documentation

## 2022-11-18 ENCOUNTER — Telehealth: Payer: Self-pay | Admitting: Gastroenterology

## 2022-11-18 ENCOUNTER — Ambulatory Visit: Payer: Commercial Managed Care - HMO | Admitting: Gastroenterology

## 2022-11-18 NOTE — Telephone Encounter (Signed)
Good morning Dr. Lavon Paganini,   Patient called stating that she needed to cancel her appointment with you today at 10:10 with no reason given.   Patient stated she would call back at a later time to reschedule.

## 2022-11-18 NOTE — Telephone Encounter (Signed)
ok 

## 2023-02-22 ENCOUNTER — Other Ambulatory Visit: Payer: Self-pay | Admitting: Family Medicine

## 2023-02-22 DIAGNOSIS — E785 Hyperlipidemia, unspecified: Secondary | ICD-10-CM

## 2023-02-22 NOTE — Telephone Encounter (Signed)
Called patient with an interpreter to schedule OV for medication refills. Patient states that she has an appointment,but did not disclose where. Asked the patient is she currently seeing Dr. Margarita Rana, patient disconnected the call. Will refuse medication request.

## 2023-02-22 NOTE — Telephone Encounter (Signed)
Unable to refill per protocol, appointment needed.   Requested Prescriptions  Pending Prescriptions Disp Refills   atorvastatin (LIPITOR) 40 MG tablet [Pharmacy Med Name: ATORVASTATIN '40MG'$  TABLETS] 90 tablet 1    Sig: TAKE 1 TABLET(40 MG) BY MOUTH DAILY     Cardiovascular:  Antilipid - Statins Failed - 02/22/2023  2:41 PM      Failed - Valid encounter within last 12 months    Recent Outpatient Visits           1 year ago Other microscopic hematuria   [place], Charlane Ferretti, MD   1 year ago Hyperlipidemia associated with type 2 diabetes mellitus [facility])   [place], Charlane Ferretti, MD   1 year ago Type 2 diabetes mellitus with hyperglycemia, unspecified whether long term insulin use [facility])   [place], [place], [place]   1 year ago Type 2 diabetes mellitus with hyperglycemia, unspecified whether long term insulin use [facility])   [place], [place], [place]              Failed - Lipid Panel in normal range within the last 12 months    Cholesterol, Total  Date Value Ref Range Status  08/05/2021 203 (H) 100 - 199 mg/dL Final   LDL Chol Calc (NIH)  Date Value Ref Range Status  08/05/2021 115 (H) 0 - 99 mg/dL Final   HDL  Date Value Ref Range Status  08/05/2021 57 >39 mg/dL Final   Triglycerides  Date Value Ref Range Status  08/05/2021 181 (H) 0 - 149 mg/dL Final         Passed - Patient is not pregnant

## 2023-02-23 ENCOUNTER — Other Ambulatory Visit: Payer: Self-pay | Admitting: Family Medicine

## 2023-02-23 DIAGNOSIS — E1169 Type 2 diabetes mellitus with other specified complication: Secondary | ICD-10-CM

## 2023-02-23 NOTE — Telephone Encounter (Signed)
Refused yesterday- needs appointment- see notes Requested Prescriptions  Pending Prescriptions Disp Refills   atorvastatin (LIPITOR) 40 MG tablet [Pharmacy Med Name: ATORVASTATIN '40MG'$  TABLETS] 90 tablet 1    Sig: TAKE 1 TABLET(40 MG) BY MOUTH DAILY     Cardiovascular:  Antilipid - Statins Failed - 02/23/2023  9:02 AM      Failed - Valid encounter within last 12 months    Recent Outpatient Visits           1 year ago Other microscopic hematuria   [place], Charlane Ferretti, MD   1 year ago Hyperlipidemia associated with type 2 diabetes mellitus [facility])   [place], Charlane Ferretti, MD   1 year ago Type 2 diabetes mellitus with hyperglycemia, unspecified whether long term insulin use [facility])   [place], [place], [place]   1 year ago Type 2 diabetes mellitus with hyperglycemia, unspecified whether long term insulin use [facility])   [place], [place], [place]              Failed - Lipid Panel in normal range within the last 12 months    Cholesterol, Total  Date Value Ref Range Status  08/05/2021 203 (H) 100 - 199 mg/dL Final   LDL Chol Calc (NIH)  Date Value Ref Range Status  08/05/2021 115 (H) 0 - 99 mg/dL Final   HDL  Date Value Ref Range Status  08/05/2021 57 >39 mg/dL Final   Triglycerides  Date Value Ref Range Status  08/05/2021 181 (H) 0 - 149 mg/dL Final         Passed - Patient is not pregnant

## 2023-05-25 ENCOUNTER — Emergency Department (HOSPITAL_COMMUNITY): Payer: Medicaid Other

## 2023-05-25 ENCOUNTER — Other Ambulatory Visit: Payer: Self-pay

## 2023-05-25 ENCOUNTER — Emergency Department (HOSPITAL_COMMUNITY)
Admission: EM | Admit: 2023-05-25 | Discharge: 2023-05-25 | Disposition: A | Discharge status: Home / Self Care | Payer: Medicaid Other | Attending: Emergency Medicine | Admitting: Emergency Medicine

## 2023-05-25 ENCOUNTER — Ambulatory Visit (HOSPITAL_COMMUNITY)
Admission: EM | Admit: 2023-05-25 | Discharge: 2023-05-25 | Disposition: A | Discharge status: Home / Self Care | Payer: Commercial Managed Care - HMO | Attending: Emergency Medicine | Admitting: Emergency Medicine

## 2023-05-25 DIAGNOSIS — Z79899 Other long term (current) drug therapy: Secondary | ICD-10-CM | POA: Insufficient documentation

## 2023-05-25 DIAGNOSIS — R200 Anesthesia of skin: Secondary | ICD-10-CM

## 2023-05-25 DIAGNOSIS — R29898 Other symptoms and signs involving the musculoskeletal system: Secondary | ICD-10-CM

## 2023-05-25 DIAGNOSIS — E119 Type 2 diabetes mellitus without complications: Secondary | ICD-10-CM | POA: Insufficient documentation

## 2023-05-25 DIAGNOSIS — R531 Weakness: Secondary | ICD-10-CM | POA: Insufficient documentation

## 2023-05-25 DIAGNOSIS — I10 Essential (primary) hypertension: Secondary | ICD-10-CM | POA: Diagnosis not present

## 2023-05-25 DIAGNOSIS — Z7982 Long term (current) use of aspirin: Secondary | ICD-10-CM | POA: Diagnosis not present

## 2023-05-25 LAB — BASIC METABOLIC PANEL
Anion gap: 10 (ref 5–15)
BUN: 10 mg/dL (ref 8–23)
CO2: 25 mmol/L (ref 22–32)
Calcium: 9.4 mg/dL (ref 8.9–10.3)
Chloride: 104 mmol/L (ref 98–111)
Creatinine, Ser: 0.67 mg/dL (ref 0.44–1.00)
GFR, Estimated: >60 mL/min (ref >60)
Glucose, Bld: 115 mg/dL — ABNORMAL HIGH (ref 70–99)
Potassium: 4.2 mmol/L (ref 3.5–5.1)
Sodium: 139 mmol/L (ref 135–145)

## 2023-05-25 LAB — CBC
HCT: 37.5 % (ref 36.0–46.0)
Hemoglobin: 11.8 g/dL — ABNORMAL LOW (ref 12.0–15.0)
MCH: 26.6 pg (ref 26.0–34.0)
MCHC: 31.5 g/dL (ref 30.0–36.0)
MCV: 84.7 fL (ref 80.0–100.0)
Platelets: 351 10*3/uL (ref 150–400)
RBC: 4.43 MIL/uL (ref 3.87–5.11)
RDW: 13.8 % (ref 11.5–15.5)
WBC: 8.1 10*3/uL (ref 4.0–10.5)
nRBC: 0 % (ref 0.0–0.2)

## 2023-05-25 LAB — POCT FASTING CBG KUC MANUAL ENTRY: POCT Glucose (KUC): 117 mg/dL — AB (ref 70–99)

## 2023-05-25 LAB — TROPONIN I (HIGH SENSITIVITY): Troponin I (High Sensitivity): 2 ng/L (ref <18)

## 2023-05-25 LAB — LIPASE, BLOOD: Lipase: 42 U/L (ref 11–51)

## 2023-05-25 MED ORDER — ASPIRIN 81 MG PO CHEW: 81.0000 mg | CHEWABLE_TABLET | Freq: Every day | ORAL | 1 refills | Status: DC

## 2023-05-25 MED ORDER — ONDANSETRON 4 MG PO TBDP
4.0000 mg | ORAL_TABLET | Freq: Once | ORAL | Status: DC
  Administered 2023-05-25: 4 mg via ORAL

## 2023-05-25 MED ORDER — ONDANSETRON HCL 4 MG/2ML IJ SOLN
4.0000 mg | Freq: Once | INTRAMUSCULAR | Status: AC
  Administered 2023-05-25: 4 mg via INTRAVENOUS
  Filled 2023-05-25: qty 2

## 2023-05-25 NOTE — ED Triage Notes (Addendum)
Pt reports a change in her medication (Aspirin 81 mg) and since then has had a weird sensation around forehead feels like her forehead is asleep" and mouth has a tingling sensation. Symptoms began today after taking the new Aspirin today.

## 2023-05-25 NOTE — Discharge Instructions (Addendum)
Lo atendieron por entumecimiento de la frente en el departamento de emergencias.   En casa, tome la aspirina que [employer].    Consulte su MyChart en lnea para conocer los resultados de cualquier prueba que no haya dado resultado cuando sali del departamento de Sports administrator.   Haga un seguimiento con su mdico de atencin primaria en 2-3 das con respecto a su visita.    Regrese inmediatamente al departamento de emergencias si experimenta cualquiera de los siguientes sntomas: debilidad en brazos o piernas, dificultad para hablar, dolor en el pecho, dificultad para respirar o cualquier otro sntoma preocupante.    Gracias por visitar nuestro Departamento de [address]. Fue un placer atenderte hoy.  ----  You were seen for your forehead numbness in the emergency department.   At home, please take the aspirin we prescribed you.    Check your MyChart online for the results of any tests that had not resulted by the time you left the emergency department.   Follow-up with your primary doctor in 2-3 days regarding your visit.    Return immediately to the emergency department if you experience any of the following: weakness of your arms or legs, slurred speech, chest pain, shortness of breath, or any other concerning symptoms.    Thank you for visiting our Emergency Department. It was a pleasure taking care of you today.

## 2023-05-25 NOTE — ED Triage Notes (Signed)
Pt sent from UC. Pt started taking aspirin and metformin and started having numbness on forehead.

## 2023-05-25 NOTE — ED Provider Notes (Signed)
[name] Provider Note   CSN: [id number] Arrival date & time: 05/25/23  1331     History  Chief Complaint  Patient presents with   Numbness    Tanya Mason is a 62 y.o. female.  62 year old female with a history of diabetes and hypertension who presents the emergency department with numbness of her forehead and strange sensation of her tongue.  Patient reports that this morning (unclear exactly as to what time) she took her baby aspirin which the pharmacist changed recently and started feeling numbness around her tongue as well as numbness on both sides of her forehead.  She went to urgent care and they reported that she had bilateral upper extremity weakness and referred her over to the emergency department for additional evaluation.  She denies any vision changes, numbness or weakness of her arms or legs to me at this time.  No neck pain.  No chest pain.  No history of stroke.  Appears that the aspirin she was given was enteric-coated rather than a chewable aspirin.  She suspects that her symptoms are likely due to the new aspirin or potentially her metformin.  Translator used for history.       Home Medications Prior to Admission medications   Medication Sig Start Date End Date Taking? Authorizing Provider  aspirin 81 MG chewable tablet Chew 1 tablet (81 mg total) by mouth daily. 05/25/23 07/24/23 Yes Rondel Baton, MD  amLODipine (NORVASC) 5 MG tablet Take 1 tablet (5 mg total) by mouth daily. 01/11/22   Hoy Register, MD  atorvastatin (LIPITOR) 40 MG tablet Take 1 tablet (40 mg total) by mouth daily. 12/10/21 03/10/22  Hoy Register, MD  dapagliflozin propanediol (FARXIGA) 5 MG TABS tablet Take 1 tablet (5 mg total) by mouth daily before breakfast. 01/11/22   Hoy Register, MD  hydrocortisone (ANUSOL-HC) 2.5 % rectal cream Place 1 application rectally 2 (two) times daily. 12/10/21   Hoy Register, MD  Multiple  Vitamin (MULTIVITAMIN) tablet Take 1 tablet by mouth daily.    [provider]  nitrofurantoin, macrocrystal-monohydrate, (MACROBID) 100 MG capsule Take 1 capsule (100 mg total) by mouth 2 (two) times daily. 01/11/22   Hoy Register, MD  ondansetron (ZOFRAN) 4 MG tablet Take 1 tablet (4 mg total) by mouth every 8 (eight) hours as needed for nausea or vomiting. 11/06/22   Horton, Mayer Masker, MD  pantoprazole (PROTONIX) 20 MG tablet Take 1 tablet (20 mg total) by mouth 2 (two) times daily. 11/06/22   Horton, Mayer Masker, MD  polyethylene glycol powder (GLYCOLAX/MIRALAX) 17 GM/SCOOP powder Take 17 g by mouth daily. 12/10/21   Hoy Register, MD  sucralfate (CARAFATE) 1 g tablet Take 1 tablet (1 g total) by mouth 4 (four) times daily -  with meals and at bedtime. 11/06/22   Horton, Mayer Masker, MD      Allergies    Aspirin and Penicillins    Review of Systems   Review of Systems  Physical Exam Updated Vital Signs BP 128/76   Pulse 71   Temp 98.2 F (36.8 C) (Oral)   Resp 18   LMP  (LMP Unknown)   SpO2 98%  Physical Exam Vitals and nursing note reviewed.  Constitutional:      General: She is not in acute distress.    Appearance: She is well-developed.  HENT:     Head: Normocephalic and atraumatic.     Right Ear: External ear normal.  Left Ear: External ear normal.     Nose: Nose normal.     Mouth/Throat:     Mouth: Mucous membranes are moist.     Pharynx: Oropharynx is clear.  Eyes:     Extraocular Movements: Extraocular movements intact.     Conjunctiva/sclera: Conjunctivae normal.     Pupils: Pupils are equal, round, and reactive to light.  Cardiovascular:     Rate and Rhythm: Normal rate and regular rhythm.     Heart sounds: No murmur heard.    Comments: Radial pulses 2+ bilaterally Pulmonary:     Effort: Pulmonary effort is normal. No respiratory distress.     Breath sounds: Normal breath sounds.  Abdominal:     General: Abdomen is flat.     Palpations:  Abdomen is soft.  Musculoskeletal:     Cervical back: Normal range of motion and neck supple.     Right lower leg: No edema.     Left lower leg: No edema.     Comments: DP pulses 2+ bilaterally  Skin:    General: Skin is warm and dry.  Neurological:     General: No focal deficit present.     Mental Status: She is alert and oriented to person, place, and time. Mental status is at baseline.     Cranial Nerves: No cranial nerve deficit.     Sensory: No sensory deficit.     Motor: No weakness.  Psychiatric:        Mood and Affect: Mood normal.     ED Results / Procedures / Treatments   Labs (all labs ordered are listed, but only abnormal results are displayed) Labs Reviewed  BASIC METABOLIC PANEL - Abnormal; Notable for the following components:      Result Value   Glucose, Bld 115 (*)    All other components within normal limits  CBC - Abnormal; Notable for the following components:   Hemoglobin 11.8 (*)    All other components within normal limits  LIPASE, BLOOD  TROPONIN I (HIGH SENSITIVITY)    EKG EKG Interpretation  Date/Time:  Wednesday May 25 2023 13:47:56 EDT Ventricular Rate:  75 PR Interval:  174 QRS Duration: 135 QT Interval:  430 QTC Calculation: 481 R Axis:   79 Text Interpretation: Sinus rhythm Left bundle branch block Confirmed by Vonita Moss [phone]) on 05/25/2023 1:59:31 PM  Radiology CT Head Wo Contrast  Result Date: 05/25/2023 CLINICAL DATA:  numbness of forehead EXAM: CT HEAD WITHOUT CONTRAST TECHNIQUE: Contiguous axial images were obtained from the base of the skull through the vertex without intravenous contrast. RADIATION DOSE REDUCTION: This exam was performed according to the departmental dose-optimization program which includes automated exposure control, adjustment of the mA and/or kV according to patient size and/or use of iterative reconstruction technique. COMPARISON:  CT head 11/28/21 FINDINGS: Brain: No evidence of acute infarction,  hemorrhage, hydrocephalus, extra-axial collection or mass lesion/mass effect. There are scattered regions of subcortical calcifications in the bilateral cerebral hemispheres, for example in the right occipital lobe (series 5, image 16), right parietal lobe (series 5, image 22). These are unchanged compared to 11/28/2021 Vascular: No hyperdense vessel or unexpected calcification. Skull: Normal. Negative for fracture. Unchanged well-circumscribed benign-appearing lucent lesion in the left frontal lobe. Sinuses/Orbits: No middle ear or mastoid effusion. Paranasal sinuses are mostly clear. Chronic fracture of the lamina papyracea on the left. Orbits are otherwise unremarkable. Other: None. IMPRESSION: No acute intracranial abnormality. Electronically Signed   By: Elige Radon.D.  On: 05/25/2023 15:36   DG Chest 2 View  Result Date: 05/25/2023 CLINICAL DATA:  Shoulder pain EXAM: CHEST - 2 VIEW COMPARISON:  Chest x-ray 11/28/2021 FINDINGS: The heart size and mediastinal contours are within normal limits. Both lungs are clear. The visualized skeletal structures are unremarkable. IMPRESSION: No active cardiopulmonary disease. Electronically Signed   By: Darliss Cheney M.D.   On: 05/25/2023 15:27    Procedures Procedures    Medications Ordered in ED Medications  ondansetron (ZOFRAN) injection 4 mg (4 mg Intravenous Given 05/25/23 1421)    ED Course/ Medical Decision Making/ A&P                             Medical Decision Making Amount and/or Complexity of Data Reviewed Labs: ordered. Radiology: ordered.  Risk OTC drugs. Prescription drug management.   Stesha Oxley Rosana Hoes is a 62 y.o. female with comorbidities that complicate the patient evaluation including hypertension and hyperlipidemia who presents to the emergency department with forehead numbness in the setting of taking her aspirin  Initial Ddx:  Stroke, ICH, mass migraine, paresthesias, medication reaction  MDM:  Feel  stroke is highly unlikely.  Bilateral numbness of the forehead does not fit with a stroke.  Has no focal neurologic deficits on my exam at this time though at urgent care they are reporting that she is having difficulty lifting up both her arms.  This also would not be consistent with a stroke either with it being bilateral.  With her age and change in sensation of her forehead bilaterally will obtain a CT of the head just rule out any sort of mass or possibly bleed I feel this is less likely.  Also obtain blood work and EKG at this time.  Patient later was complaining of left shoulder pain that has been going on for some time and did have some nausea in the emergency department so we will add on a troponin and EKG and chest x-ray.  Left shoulder does not show any obvious abnormalities and she is able to fully range her shoulder at this time.  Plan:  Labs Troponin EKG CT head Chest x-ray Zofran  ED Summary/Re-evaluation:  Patient did not have any additional nausea or vomiting in the emergency department.  EKG and troponin and chest x-ray were unremarkable.  CT head unremarkable.  Unclear exactly what is causing the patient's symptoms at this time though it could be related to her enteric-coated aspirin as she has been trying to chew it so we will prescribe her chewable aspirin at this time.  Will have her follow-up with her primary doctor in several days.  Return precautions discussed prior to discharge.  This patient presents to the ED for concern of complaints listed in HPI, this involves an extensive number of treatment options, and is a complaint that carries with it a high risk of complications and morbidity. Disposition including potential need for admission considered.   Dispo: DC Home. Return precautions discussed including, but not limited to, those listed in the AVS. Allowed pt time to ask questions which were answered fully prior to dc.  Records reviewed Outpatient Clinic Notes The  following labs were independently interpreted: Chemistry and show no acute abnormality I independently reviewed the following imaging with scope of interpretation limited to determining acute life threatening conditions related to emergency care: CT Head and agree with the radiologist interpretation with the following exceptions: none I personally reviewed and interpreted  the pt's EKG: see above for interpretation  I have reviewed the patients home medications and made adjustments as needed Social Determinants of health:  Spanish-speaking only         Final Clinical Impression(s) / ED Diagnoses Final diagnoses:  Numbness    Rx / DC Orders ED Discharge Orders          Ordered    aspirin 81 MG chewable tablet  Daily        05/25/23 1623              Rondel Baton, MD 05/25/23 1658

## 2023-05-25 NOTE — ED Notes (Signed)
Attempted to call report to [employer] RN x 2 with no answer.

## 2023-05-25 NOTE — ED Provider Notes (Signed)
Patient presents to clinic for facial numbness, bilateral limb weakness and arm pain since around 8, 9, or 10 AM this morning, patient is unsure.  She is a difficult historian, Engineer, structural used.  Reports she recently started a new aspirin this morning, appears to potentially started metformin.  Patient reports she has been having frequent diarrhea. On PE patient is unable to hold her arms up, she lets them fall despite instruction to keep them elevated.  Bilateral grip strength weakened.  Ambulatory to the restroom without obvious deficit or weaknesses.  Patient CBG in clinic 117. Patient concerned cover cost. Discussed that further evaluation is needed to find the cause of her symptoms, could be metformin SE, unclear hx, or potentially CVA or other acute process. Recommended evaluation at nearest ED.   Of note, no pronator drift found when the [facility] staff attempted exam.    Gennell How, [place] N, FNP 05/25/23 1328

## 2023-07-22 ENCOUNTER — Emergency Department (HOSPITAL_COMMUNITY)
Admission: EM | Admit: 2023-07-22 | Discharge: 2023-07-23 | Disposition: A | Discharge status: Home / Self Care | Payer: Medicaid Other | Attending: Emergency Medicine | Admitting: Emergency Medicine

## 2023-07-22 ENCOUNTER — Emergency Department (HOSPITAL_COMMUNITY): Payer: Medicaid Other

## 2023-07-22 ENCOUNTER — Encounter (HOSPITAL_COMMUNITY): Payer: Self-pay | Admitting: Emergency Medicine

## 2023-07-22 ENCOUNTER — Other Ambulatory Visit: Payer: Self-pay

## 2023-07-22 DIAGNOSIS — I10 Essential (primary) hypertension: Secondary | ICD-10-CM | POA: Insufficient documentation

## 2023-07-22 DIAGNOSIS — Z760 Encounter for issue of repeat prescription: Secondary | ICD-10-CM | POA: Diagnosis not present

## 2023-07-22 DIAGNOSIS — Z79899 Other long term (current) drug therapy: Secondary | ICD-10-CM | POA: Diagnosis not present

## 2023-07-22 DIAGNOSIS — Z7982 Long term (current) use of aspirin: Secondary | ICD-10-CM | POA: Diagnosis not present

## 2023-07-22 DIAGNOSIS — E119 Type 2 diabetes mellitus without complications: Secondary | ICD-10-CM | POA: Insufficient documentation

## 2023-07-22 DIAGNOSIS — R079 Chest pain, unspecified: Secondary | ICD-10-CM | POA: Insufficient documentation

## 2023-07-22 LAB — TROPONIN I (HIGH SENSITIVITY): Troponin I (High Sensitivity): <2 ng/L (ref <18)

## 2023-07-22 LAB — BASIC METABOLIC PANEL
Anion gap: 10 (ref 5–15)
BUN: 7 mg/dL — ABNORMAL LOW (ref 8–23)
CO2: 25 mmol/L (ref 22–32)
Calcium: 8.8 mg/dL — ABNORMAL LOW (ref 8.9–10.3)
Chloride: 100 mmol/L (ref 98–111)
Creatinine, Ser: 0.68 mg/dL (ref 0.44–1.00)
GFR, Estimated: >60 mL/min (ref >60)
Glucose, Bld: 93 mg/dL (ref 70–99)
Potassium: 3.7 mmol/L (ref 3.5–5.1)
Sodium: 135 mmol/L (ref 135–145)

## 2023-07-22 LAB — CBC
HCT: 37 % (ref 36.0–46.0)
Hemoglobin: 11.5 g/dL — ABNORMAL LOW (ref 12.0–15.0)
MCH: 26.3 pg (ref 26.0–34.0)
MCHC: 31.1 g/dL (ref 30.0–36.0)
MCV: 84.5 fL (ref 80.0–100.0)
Platelets: 327 10*3/uL (ref 150–400)
RBC: 4.38 MIL/uL (ref 3.87–5.11)
RDW: 13.8 % (ref 11.5–15.5)
WBC: 6.9 10*3/uL (ref 4.0–10.5)
nRBC: 0 % (ref 0.0–0.2)

## 2023-07-22 NOTE — ED Triage Notes (Signed)
Pt presents initially requesting refill on her 81 mg aspirin. She states (using the interpreter) that she was told by her doctor to stop taking the aspirin and but she has not taken it the past 2 days and has had stabbing chest pain.  Pt states she knows she can buy aspirin over the counter but it is not the same effectiveness for her CP as the prescription one.  Reiterated to patient there is no difference in 81 mg ASA OTC vs prescription.  Pt adamantly insists the prescription is all that works for her.  Pt continues to report to me stabbing chest pain that goes through to her back.  Explained to patient we need to check out her chest pain and she may discuss with the provider in the back the problem with her aspirin prescription. Pt is agreeable.

## 2023-07-23 MED ORDER — ONDANSETRON HCL 4 MG/2ML IJ SOLN: 4.0000 mg | Freq: Once | INTRAMUSCULAR | Status: DC

## 2023-07-23 MED ORDER — ONDANSETRON 4 MG PO TBDP
8.0000 mg | ORAL_TABLET | Freq: Once | ORAL | Status: AC
  Administered 2023-07-23: 8 mg via ORAL
  Filled 2023-07-23: qty 2

## 2023-07-23 MED ORDER — ASPIRIN 81 MG PO CHEW: 81.0000 mg | CHEWABLE_TABLET | Freq: Every day | ORAL | 0 refills | Status: AC

## 2023-07-23 MED ORDER — ALUM & MAG HYDROXIDE-SIMETH 200-200-20 MG/5ML PO SUSP
30.0000 mL | Freq: Once | ORAL | Status: AC
  Administered 2023-07-23: 30 mL via ORAL
  Filled 2023-07-23: qty 30

## 2023-07-23 MED ORDER — ASPIRIN 81 MG PO CHEW
81.0000 mg | CHEWABLE_TABLET | Freq: Once | ORAL | Status: AC
  Administered 2023-07-23: 81 mg via ORAL
  Filled 2023-07-23: qty 1

## 2023-07-23 NOTE — ED Notes (Signed)
Patient vomited after ODT zofran, chewable aspirin and maalox. She is not vomiting at this time. I went to start an IV to try IV zofran the provider order and she refused. She said that she does not want anymore medicine that will make her sick. I tried to explain using the interpreter that this is supposed to stop that feeling and work better but she does not want it still. The provider was made aware, patient refused medications saying she had too many already and she does not want anymore because of how she feels. She will be encouraged to come back if she feels worse and wants to comply with the care plan and try IV medications.

## 2023-07-23 NOTE — ED Provider Notes (Signed)
[place] EMERGENCY DEPARTMENT AT [facility] Provider Note   CSN: [id number] Arrival date & time: 07/22/23  2211     History  Chief Complaint  Patient presents with   Chest Pain    Tanya Mason is a 62 y.o. female.  The history is provided by the patient. The history is limited by a language barrier. A language interpreter was used.  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  States her PMD is refusing to refill her chewable ASA and she does not know why.  Patient has Left sided CP.  No SOB,. No DOE.  No pleuritic component,  no travel     Past Medical History:  Diagnosis Date   Diabetes 1.5, managed as type 2 (HCC)    Diabetes mellitus without complication (HCC)    Hypertension      Home Medications Prior to Admission medications   Medication Sig Start Date End Date Taking? Authorizing Provider  amLODipine (NORVASC) 5 MG tablet Take 1 tablet (5 mg total) by mouth daily. 01/11/22   Hoy Register, MD  aspirin 81 MG chewable tablet Chew 1 tablet (81 mg total) by mouth daily. 07/23/23 09/21/23  Shaleena Crusoe, MD  atorvastatin (LIPITOR) 40 MG tablet Take 1 tablet (40 mg total) by mouth daily. 12/10/21 03/10/22  Hoy Register, MD  dapagliflozin propanediol (FARXIGA) 5 MG TABS tablet Take 1 tablet (5 mg total) by mouth daily before breakfast. 01/11/22   Hoy Register, MD   hydrocortisone (ANUSOL-HC) 2.5 % rectal cream Place 1 application rectally 2 (two) times daily. 12/10/21   Hoy Register, MD  Multiple Vitamin (MULTIVITAMIN) tablet Take 1 tablet by mouth daily.    [provider]  nitrofurantoin, macrocrystal-monohydrate, (MACROBID) 100 MG capsule Take 1 capsule (100 mg total) by mouth 2 (two) times daily. 01/11/22   Hoy Register, MD  ondansetron (ZOFRAN) 4 MG tablet Take 1 tablet (4 mg total) by mouth every 8 (eight) hours as needed for nausea or vomiting. 11/06/22   Horton, Mayer Masker, MD  pantoprazole (PROTONIX) 20 MG tablet Take 1 tablet (20 mg total) by mouth 2 (two) times daily. 11/06/22   Horton, Mayer Masker, MD  polyethylene glycol powder (GLYCOLAX/MIRALAX) 17 GM/SCOOP powder Take 17 g by mouth daily. 12/10/21   Hoy Register, MD  sucralfate (CARAFATE) 1 g tablet Take 1 tablet (1 g total) by mouth 4 (four) times daily -  with meals and at bedtime. 11/06/22   Horton, Mayer Masker, MD      Allergies    Aspirin and Penicillins    Review of Systems   Review of Systems  Constitutional:  Negative for fever.  HENT:  Negative for ear pain.   Eyes:  Negative for redness.  Respiratory:  Negative for cough, shortness of breath, wheezing and stridor.   Cardiovascular:  Positive for chest pain. Negative for syncope.  Gastrointestinal:  Negative for  abdominal pain and anorexia.  Neurological:  Negative for weakness.  All other systems reviewed and are negative.   Physical Exam Updated Vital Signs BP 117/73   Pulse 66   Temp 97.7 F (36.5 C) (Oral)   Resp 16   LMP  (LMP Unknown)   SpO2 100%  Physical Exam Vitals and nursing note reviewed.  Constitutional:      General: She is not in acute distress.    Appearance: Normal appearance. She is well-developed.  HENT:     Head: Normocephalic and atraumatic.     Nose: Nose normal.  Eyes:     Pupils: Pupils are equal, round, and reactive to light.  Cardiovascular:     Rate and Rhythm:  Normal rate and regular rhythm.     Pulses: Normal pulses.     Heart sounds: Normal heart sounds.  Pulmonary:     Effort: Pulmonary effort is normal. No respiratory distress.     Breath sounds: Normal breath sounds.  Abdominal:     General: Bowel sounds are normal. There is no distension.     Palpations: Abdomen is soft.     Tenderness: There is no abdominal tenderness. There is no guarding or rebound.  Genitourinary:    Vagina: No vaginal discharge.  Musculoskeletal:        General: Normal range of motion.     Cervical back: Neck supple.  Skin:    General: Skin is warm and dry.     Capillary Refill: Capillary refill takes less than 2 seconds.     Findings: No erythema or rash.  Neurological:     General: No focal deficit present.     Mental Status: She is alert.     Deep Tendon Reflexes: Reflexes normal.  Psychiatric:        Thought Content: Thought content normal.     ED Results / Procedures / Treatments   Labs (all labs ordered are listed, but only abnormal results are displayed) Results for orders placed or performed during the hospital encounter of 07/22/23  Basic metabolic panel  Result Value Ref Range   Sodium 135 135 - 145 mmol/L   Potassium 3.7 3.5 - 5.1 mmol/L   Chloride 100 98 - 111 mmol/L   CO2 25 22 - 32 mmol/L   Glucose, Bld 93 70 - 99 mg/dL   BUN 7 (L) 8 - 23 mg/dL   Creatinine, Ser 1.32 0.44 - 1.00 mg/dL   Calcium 8.8 (L) 8.9 - 10.3 mg/dL   GFR, Estimated >44 >01 mL/min   Anion gap 10 5 - 15  CBC  Result Value Ref Range   WBC 6.9 4.0 - 10.5 K/uL   RBC 4.38 3.87 - 5.11 MIL/uL   Hemoglobin 11.5 (L) 12.0 - 15.0 g/dL   HCT 02.7 25.3 - 66.4 %   MCV 84.5 80.0 - 100.0 fL   MCH 26.3 26.0 - 34.0 pg   MCHC 31.1 30.0 - 36.0 g/dL   RDW 40.3 47.4 - 25.9 %   Platelets 327 150 - 400 K/uL   nRBC 0.0 0.0 - 0.2 %  Troponin I (High Sensitivity)  Result Value Ref Range   Troponin I (High Sensitivity) <2 <18 ng/L  Troponin I (High Sensitivity)  Result Value Ref  Range   Troponin I (High Sensitivity) 3 <18 ng/L   DG Chest 2 View  Result Date: 07/22/2023 CLINICAL DATA:  CP EXAM: CHEST - 2 VIEW COMPARISON:  Chest x-ray 05/25/2023. FINDINGS: The heart and mediastinal  contours are unchanged. Aortic calcification. Biapical pleural/pulmonary scarring. No focal consolidation. No pulmonary edema. No pleural effusion. No pneumothorax. No acute osseous abnormality. IMPRESSION: 1. No active cardiopulmonary disease. 2.  Aortic Atherosclerosis (ICD10-I70.0). Electronically Signed   By: Tish Frederickson M.D.   On: 07/22/2023 23:00    EKG EKG Interpretation Date/Time:  Friday July 22 2023 22:17:34 EDT Ventricular Rate:  66 PR Interval:  186 QRS Duration:  134 QT Interval:  458 QTC Calculation: 480 R Axis:   70  Text Interpretation: Normal sinus rhythm Left bundle branch block Confirmed by Nicanor Alcon, Darrly Loberg ([id number]) on 07/23/2023 12:53:07 AM  Radiology DG Chest 2 View  Result Date: 07/22/2023 CLINICAL DATA:  CP EXAM: CHEST - 2 VIEW COMPARISON:  Chest x-ray 05/25/2023. FINDINGS: The heart and mediastinal contours are unchanged. Aortic calcification. Biapical pleural/pulmonary scarring. No focal consolidation. No pulmonary edema. No pleural effusion. No pneumothorax. No acute osseous abnormality. IMPRESSION: 1. No active cardiopulmonary disease. 2.  Aortic Atherosclerosis (ICD10-I70.0). Electronically Signed   By: Tish Frederickson M.D.   On: 07/22/2023 23:00    Procedures Procedures    Medications Ordered in ED Medications  ondansetron (ZOFRAN) injection 4 mg (has no administration in time range)  aspirin chewable tablet 81 mg (81 mg Oral Given 07/23/23 0216)  ondansetron (ZOFRAN-ODT) disintegrating tablet 8 mg (8 mg Oral Given 07/23/23 0217)  alum & mag hydroxide-simeth (MAALOX/MYLANTA) 200-200-20 MG/5ML suspension 30 mL (30 mLs Oral Given 07/23/23 0216)    ED Course/ Medical Decision Making/ A&P                                 Medical Decision Making Patient  feeling poorly since being out of her chewable ASA  Amount and/or Complexity of Data Reviewed External Data Reviewed: notes.    Details: Previous notes reviewed  Labs: ordered.    Details: Normal white count 6.9, low hemoglobin 11.5, normal platelets 327. Normal troponins < 2 and 3, normal sodium 135, normal creatinine .69 Radiology: ordered and independent interpretation performed.    Details: Negative CXR ECG/medicine tests: ordered and independent interpretation performed. Decision-making details documented in ED Course.  Risk OTC drugs. Prescription drug management. Risk Details: I have reviewed labs EKG and imaging with patient via interpreter.  I do not believe this is a PE.  Patient vomited the GI cocktail and then refused IV for zofran and reportedly told her nurse it was making her sick     Final Clinical Impression(s) / ED Diagnoses Final diagnoses:  Medication refill   Return for intractable cough, coughing up blood, fevers > 100.4 unrelieved by medication, shortness of breath, intractable vomiting, chest pain, shortness of breath, weakness, numbness, changes in speech, facial asymmetry, abdominal pain, passing out, Inability to tolerate liquids or food, cough, altered mental status or any concerns. No signs of systemic illness or infection. The patient is nontoxic-appearing on exam and vital signs are within normal limits.  I have reviewed the triage vital signs and the nursing notes. Pertinent labs & imaging results that were available during my care of the patient were reviewed by me and considered in my medical decision making (see chart for details). After history, exam, and medical workup I feel the patient has been appropriately medically screened and is safe for discharge home. Pertinent diagnoses were discussed with the patient. Patient was given return precautions. Rx / DC Orders ED Discharge Orders  Ordered    aspirin 81 MG chewable tablet  Daily         07/23/23 0305              Michaelyn Wall, MD 07/23/23 [id number]

## 2023-07-23 NOTE — ED Provider Notes (Signed)
Patient had to be redirected multiple times by interpreter as she was yelling about her aspirin and was unable to tell EDP and interpreter why this was discontinued by her PMD.  She is angry about PMD stopping this medication.  EDP stated I would absolutely refill medication for one month but cannot do a long term refill and she would need to discuss with her PMD why her PMD does not want the patient on this medication.  I did not attempt to redirect patient regarding that there was no difference between ASA prescribed and OTC as she was yelling at interpreter telling him there was a big difference.  I reassured patient that all labs and imaging were benign and reassuring but patient wants to know why she still does not feel well.  EDP politely explained I would rule out all life threatening conditions but she would need to follow up if she continues to feel unwell as It is not related to missing a baby aspirin for 2 days.     Tanya Kreher, MD 07/23/23 [phone]

## 2023-11-07 ENCOUNTER — Ambulatory Visit (HOSPITAL_COMMUNITY)
Admission: EM | Admit: 2023-11-07 | Discharge: 2023-11-07 | Disposition: A | Discharge status: Home / Self Care | Payer: Medicaid Other | Attending: Internal Medicine | Admitting: Internal Medicine

## 2023-11-07 ENCOUNTER — Encounter (HOSPITAL_COMMUNITY): Payer: Self-pay

## 2023-11-07 DIAGNOSIS — J069 Acute upper respiratory infection, unspecified: Secondary | ICD-10-CM | POA: Diagnosis not present

## 2023-11-07 MED ORDER — CETIRIZINE HCL 10 MG PO TABS: 10.0000 mg | ORAL_TABLET | Freq: Every day | ORAL | 0 refills | Status: AC

## 2023-11-07 MED ORDER — BENZONATATE 100 MG PO CAPS: 100.0000 mg | ORAL_CAPSULE | Freq: Three times a day (TID) | ORAL | 0 refills | Status: AC | PRN

## 2023-11-07 MED ORDER — FLUTICASONE PROPIONATE 50 MCG/ACT NA SUSP: 1.0000 | Freq: Every day | NASAL | 0 refills | Status: AC

## 2023-11-07 NOTE — ED Triage Notes (Signed)
Patient here today with c/o productive cough, congestion, ST, and belly pain X 8 days. The cough keeps her up at night. No sick contacts. No recent travel.

## 2023-11-07 NOTE — Discharge Instructions (Addendum)
Please increase oral fluid intake Please take medications as prescribed Humidifier and VapoRub use will help with nasal congestion Please feel free to return to urgent care if you have worsening symptoms.

## 2023-11-09 NOTE — ED Provider Notes (Signed)
[facility]    CSN: [id number] Arrival date & time: 11/07/23  4098      History   Chief Complaint Chief Complaint  Patient presents with   Cough    HPI Tanya Mason is a 62 y.o. female comes to the urgent care with a day history of productive cough, nasal congestion and abdominal pain.  Patient describes the cough as severe and associated with abdominal pain.  The abdominal pain is worse with coughing.  No known relieving factors.  No nausea or vomiting.  Patient denies any wheezing.  She has had some sore throat which has improved over the past several days.  No sick contacts.  No recent travel.  No wheezing.  No fever or chills.  Patient says that his symptoms have comes yearly around this time of the year.  She has had some itchy nose, nasal congestion but denies any itchy eyes.  She has some itchy throat.  She endorses postnasal drainage.  HPI  Past Medical History:  Diagnosis Date   Diabetes 1.5, managed as type 2 (HCC)    Diabetes mellitus without complication (HCC)    Hypertension     Patient Active Problem List   Diagnosis Date Noted   Abdominal pain, epigastric 11/17/2022   Diabetes mellitus without complication (HCC)    Hyperlipidemia associated with type 2 diabetes mellitus (HCC) 04/30/2021   LBBB (left bundle branch block) 04/30/2021   Atypical chest pain 03/30/2021    Past Surgical History:  Procedure Laterality Date   ABDOMINAL HYSTERECTOMY     CHOLECYSTECTOMY      OB History   No obstetric history on file.      Home Medications    Prior to Admission medications   Medication Sig Start Date End Date Taking? Authorizing Provider  benzonatate (TESSALON) 100 MG capsule Take 1 capsule (100 mg total) by mouth 3 (three) times daily as needed for cough. 11/07/23  Yes Imanie Darrow, Britta Mccreedy, MD  cetirizine (ZYRTEC ALLERGY) 10 MG tablet Take 1 tablet (10 mg total) by mouth daily. 11/07/23  Yes Klaudia Beirne, Britta Mccreedy, MD  fluticasone  (FLONASE) 50 MCG/ACT nasal spray Place 1 spray into both nostrils daily. 11/07/23  Yes Tanya Goytia, Britta Mccreedy, MD  isosorbide mononitrate (IMDUR) 30 MG 24 hr tablet Take 30 mg by mouth daily. 08/17/23  Yes [provider]  lisinopril (ZESTRIL) 5 MG tablet Take 5 mg by mouth daily. 08/17/23  Yes [provider]  metFORMIN (GLUCOPHAGE) 500 MG tablet Take 500 mg by mouth 2 (two) times daily. 08/17/23  Yes [provider]  atorvastatin (LIPITOR) 40 MG tablet Take 1 tablet (40 mg total) by mouth daily. 12/10/21 03/10/22  Hoy Register, MD  hydrocortisone (ANUSOL-HC) 2.5 % rectal cream Place 1 application rectally 2 (two) times daily. 12/10/21   Hoy Register, MD  Multiple Vitamin (MULTIVITAMIN) tablet Take 1 tablet by mouth daily.    [provider]    Family History Family History  Family history unknown: Yes    Social History Social History   Tobacco Use   Smoking status: Never   Smokeless tobacco: Never  Vaping Use   Vaping status: Never Used  Substance Use Topics   Alcohol use: Never   Drug use: Never     Allergies   Aspirin and Penicillins   Review of Systems Review of Systems As per HPI  Physical Exam Triage Vital Signs ED Triage Vitals  Encounter Vitals Group     BP 11/07/23 1038  120/66     Systolic BP Percentile --      Diastolic BP Percentile --      Pulse Rate 11/07/23 1038 82     Resp 11/07/23 1038 16     Temp 11/07/23 1038 98.6 F (37 C)     Temp Source 11/07/23 1038 Oral     SpO2 11/07/23 1038 96 %     Weight --      Height 11/07/23 1038 5\' 2"  (1.575 m)     Head Circumference --      Peak Flow --      Pain Score 11/07/23 1037 3     Pain Loc --      Pain Education --      Exclude from Growth Chart --    No data found.  Updated Vital Signs BP 120/66 (BP Location: Right Arm)   Pulse 82   Temp 98.6 F (37 C) (Oral)   Resp 16   Ht 5\' 2"  (1.575 m)   LMP  (LMP Unknown)   SpO2 96%   BMI 24.69 kg/m   Visual  Acuity Right Eye Distance:   Left Eye Distance:   Bilateral Distance:    Right Eye Near:   Left Eye Near:    Bilateral Near:     Physical Exam Vitals and nursing note reviewed.  Constitutional:      General: She is not in acute distress.    Appearance: She is not ill-appearing.  HENT:     Right Ear: Tympanic membrane normal.     Left Ear: Tympanic membrane normal.     Mouth/Throat:     Mouth: Mucous membranes are moist.     Pharynx: No oropharyngeal exudate or posterior oropharyngeal erythema.  Cardiovascular:     Rate and Rhythm: Normal rate and regular rhythm.     Pulses: Normal pulses.     Heart sounds: Normal heart sounds.  Pulmonary:     Effort: Pulmonary effort is normal.     Breath sounds: Normal breath sounds.  Abdominal:     General: Bowel sounds are normal.     Palpations: Abdomen is soft.  Neurological:     Mental Status: She is alert.      UC Treatments / Results  Labs (all labs ordered are listed, but only abnormal results are displayed) Labs Reviewed - No data to display  EKG   Radiology No results found.  Procedures Procedures (including critical care time)  Medications Ordered in UC Medications - No data to display  Initial Impression / Assessment and Plan / UC Course  I have reviewed the triage vital signs and the nursing notes.  Pertinent labs & imaging results that were available during my care of the patient were reviewed by me and considered in my medical decision making (see chart for details).     1.  Viral URI with cough: Tessalon Perles as needed for cough Fluticasone nasal spray Zyrtec daily Humidifier and VapoRub precautions given Return precautions given. Final Clinical Impressions(s) / UC Diagnoses   Final diagnoses:  Viral URI with cough     Discharge Instructions      Please increase oral fluid intake Please take medications as prescribed Humidifier and VapoRub use will help with nasal congestion Please feel  free to return to urgent care if you have worsening symptoms.   ED Prescriptions     Medication Sig Dispense Auth. Provider   benzonatate (TESSALON) 100 MG capsule Take 1 capsule (100 mg total)  by mouth 3 (three) times daily as needed for cough. 21 capsule Myla Mauriello, Britta Mccreedy, MD   fluticasone (FLONASE) 50 MCG/ACT nasal spray Place 1 spray into both nostrils daily. 16 g Merrilee Jansky, MD   cetirizine (ZYRTEC ALLERGY) 10 MG tablet Take 1 tablet (10 mg total) by mouth daily. 30 tablet Darrel Gloss, Britta Mccreedy, MD      PDMP not reviewed this encounter.   Merrilee Jansky, MD 11/09/23 1056

## 2024-09-05 ENCOUNTER — Other Ambulatory Visit: Payer: Self-pay

## 2024-09-05 ENCOUNTER — Emergency Department (HOSPITAL_COMMUNITY)
Admission: EM | Admit: 2024-09-05 | Discharge: 2024-09-05 | Disposition: A | Discharge status: Home / Self Care | Attending: Emergency Medicine | Admitting: Emergency Medicine

## 2024-09-05 ENCOUNTER — Emergency Department (HOSPITAL_COMMUNITY)

## 2024-09-05 DIAGNOSIS — Z7984 Long term (current) use of oral hypoglycemic drugs: Secondary | ICD-10-CM | POA: Diagnosis not present

## 2024-09-05 DIAGNOSIS — Z79899 Other long term (current) drug therapy: Secondary | ICD-10-CM | POA: Diagnosis not present

## 2024-09-05 DIAGNOSIS — R059 Cough, unspecified: Secondary | ICD-10-CM | POA: Diagnosis present

## 2024-09-05 DIAGNOSIS — U071 COVID-19: Secondary | ICD-10-CM | POA: Insufficient documentation

## 2024-09-05 DIAGNOSIS — E1165 Type 2 diabetes mellitus with hyperglycemia: Secondary | ICD-10-CM | POA: Insufficient documentation

## 2024-09-05 DIAGNOSIS — D649 Anemia, unspecified: Secondary | ICD-10-CM | POA: Insufficient documentation

## 2024-09-05 DIAGNOSIS — I10 Essential (primary) hypertension: Secondary | ICD-10-CM | POA: Insufficient documentation

## 2024-09-05 LAB — CBC WITH DIFFERENTIAL/PLATELET
Abs Immature Granulocytes: 0.02 K/uL (ref 0.00–0.07)
Basophils Absolute: 0.1 K/uL (ref 0.0–0.1)
Basophils Relative: 1 %
Eosinophils Absolute: 0.1 K/uL (ref 0.0–0.5)
Eosinophils Relative: 1 %
HCT: 35.8 % — ABNORMAL LOW (ref 36.0–46.0)
Hemoglobin: 11.4 g/dL — ABNORMAL LOW (ref 12.0–15.0)
Immature Granulocytes: 0 %
Lymphocytes Relative: 24 %
Lymphs Abs: 1.8 K/uL (ref 0.7–4.0)
MCH: 27.3 pg (ref 26.0–34.0)
MCHC: 31.8 g/dL (ref 30.0–36.0)
MCV: 85.9 fL (ref 80.0–100.0)
Monocytes Absolute: 0.8 K/uL (ref 0.1–1.0)
Monocytes Relative: 10 %
Neutro Abs: 5 K/uL (ref 1.7–7.7)
Neutrophils Relative %: 64 %
Platelets: 298 K/uL (ref 150–400)
RBC: 4.17 MIL/uL (ref 3.87–5.11)
RDW: 13.9 % (ref 11.5–15.5)
WBC: 7.7 K/uL (ref 4.0–10.5)
nRBC: 0 % (ref 0.0–0.2)

## 2024-09-05 LAB — RESP PANEL BY RT-PCR (RSV, FLU A&B, COVID)  RVPGX2
Influenza A by PCR: NEGATIVE
Influenza B by PCR: NEGATIVE
Resp Syncytial Virus by PCR: NEGATIVE
SARS Coronavirus 2 by RT PCR: POSITIVE — AB

## 2024-09-05 LAB — BASIC METABOLIC PANEL WITH GFR
Anion gap: 9 (ref 5–15)
BUN: 10 mg/dL (ref 8–23)
CO2: 27 mmol/L (ref 22–32)
Calcium: 8.5 mg/dL — ABNORMAL LOW (ref 8.9–10.3)
Chloride: 101 mmol/L (ref 98–111)
Creatinine, Ser: 0.76 mg/dL (ref 0.44–1.00)
GFR, Estimated: >60 mL/min (ref >60)
Glucose, Bld: 119 mg/dL — ABNORMAL HIGH (ref 70–99)
Potassium: 3.9 mmol/L (ref 3.5–5.1)
Sodium: 137 mmol/L (ref 135–145)

## 2024-09-05 NOTE — ED Triage Notes (Signed)
 Patient reports persistent dry cough /chest congestion for 4 days . No fever or chills .

## 2024-09-05 NOTE — ED Provider Triage Note (Signed)
 Emergency Medicine Provider Triage Evaluation Note  Tanya Mason , a 63 y.o. female  was evaluated in triage.  Pt complains of cough x4 days. Entire house is sick. Denies chest pain or SOB. Endorses rhinorrhea and sore throat. Has been taking tylenol .  Review of Systems  Positive:  Negative:   Physical Exam  BP 122/62 (BP Location: Left Arm)   Pulse 93   Temp 99.9 F (37.7 C)   Resp 16   LMP  (LMP Unknown)   SpO2 96%  Gen:   Awake, no distress   Resp:  Normal effort  MSK:   Moves extremities without difficulty  Other:    Medical Decision Making  Medically screening exam initiated at 12:34 AM.  Appropriate orders placed.  Tanya Mason was informed that the remainder of the evaluation will be completed by another provider, this initial triage assessment does not replace that evaluation, and the importance of remaining in the ED until their evaluation is complete.     Hoy Fraction F, [place] 09/05/24 [phone]

## 2024-09-05 NOTE — Discharge Instructions (Signed)
 As discussed, please follow-up with your primary care provider.  Seek emergency care if experiencing any new or worsening symptoms.  Como ya se mencion, consulte con su mdico de Information systems manager. Busque atencin de urgencias si experimenta sntomas nuevos o que empeoran.

## 2024-09-05 NOTE — ED Provider Notes (Signed)
 [facility] Provider Note   CSN: [id number] Arrival date & time: 09/05/24  0021     Patient presents with: Cough (Covid+)   Tanya Mason is a 63 y.o. female with PMHx DM, HTN, HLD who presents to ED concerned for rhinorrhea, sore throat, cough x4 days. Patient stating that her entire family is sick with similar symptoms at home. Patient denies chest pain or SOB or fever. Patient has been taking tylenol   for symptoms which has been helping.   Spanish translator Malen [phone] assisting with interview      Cough      Prior to Admission medications   Medication Sig Start Date End Date Taking? Authorizing Provider  atorvastatin  (LIPITOR) 40 MG tablet Take 1 tablet (40 mg total) by mouth daily. 12/10/21 03/10/22  Newlin, Enobong, MD  benzonatate  (TESSALON ) 100 MG capsule Take 1 capsule (100 mg total) by mouth 3 (three) times daily as needed for cough. 11/07/23   LampteyAleene KIDD, MD  cetirizine  (ZYRTEC  ALLERGY) 10 MG tablet Take 1 tablet (10 mg total) by mouth daily. 11/07/23   LampteyAleene KIDD, MD  fluticasone  (FLONASE ) 50 MCG/ACT nasal spray Place 1 spray into both nostrils daily. 11/07/23   Blaise Aleene KIDD, MD  hydrocortisone  (ANUSOL -HC) 2.5 % rectal cream Place 1 application rectally 2 (two) times daily. 12/10/21   Newlin, Enobong, MD  isosorbide  mononitrate (IMDUR ) 30 MG 24 hr tablet Take 30 mg by mouth daily. 08/17/23   [provider]  lisinopril (ZESTRIL) 5 MG tablet Take 5 mg by mouth daily. 08/17/23   [provider]  metFORMIN  (GLUCOPHAGE ) 500 MG tablet Take 500 mg by mouth 2 (two) times daily. 08/17/23   [provider]  Multiple Vitamin (MULTIVITAMIN) tablet Take 1 tablet by mouth daily.    [provider]    Allergies: Aspirin , Tetracyclines & related, and Penicillins    Review of Systems  Respiratory:  Positive for cough.     Updated Vital Signs BP 116/70 (BP  Location: Right Arm)   Pulse 84   Temp 99.4 F (37.4 C) (Oral)   Resp 16   LMP  (LMP Unknown)   SpO2 97%   Physical Exam Vitals and nursing note reviewed.  Constitutional:      General: She is not in acute distress.    Appearance: She is not ill-appearing or toxic-appearing.  HENT:     Head: Normocephalic and atraumatic.     Mouth/Throat:     Mouth: Mucous membranes are moist.     Pharynx: No oropharyngeal exudate or posterior oropharyngeal erythema.     Comments: No oropharyngeal exudates or erythema Eyes:     General: No scleral icterus.       Right eye: No discharge.        Left eye: No discharge.     Conjunctiva/sclera: Conjunctivae normal.  Cardiovascular:     Rate and Rhythm: Normal rate and regular rhythm.     Pulses: Normal pulses.     Heart sounds: Normal heart sounds. No murmur heard. Pulmonary:     Effort: Pulmonary effort is normal. No respiratory distress.     Breath sounds: Normal breath sounds. No wheezing, rhonchi or rales.  Abdominal:     General: Abdomen is flat. Bowel sounds are normal. There is no distension.     Palpations: Abdomen is soft. There is no mass.     Tenderness: There is no abdominal tenderness.  Musculoskeletal:  Right lower leg: No edema.     Left lower leg: No edema.  Skin:    General: Skin is warm and dry.     Findings: No rash.  Neurological:     General: No focal deficit present.     Mental Status: She is alert and oriented to person, place, and time. Mental status is at baseline.  Psychiatric:        Mood and Affect: Mood normal.        Behavior: Behavior normal.     (all labs ordered are listed, but only abnormal results are displayed) Labs Reviewed  RESP PANEL BY RT-PCR (RSV, FLU A&B, COVID)  [id number] - Abnormal; Notable for the following components:      Result Value   SARS Coronavirus 2 by RT PCR POSITIVE (*)    All other components within normal limits  BASIC METABOLIC PANEL WITH GFR - Abnormal; Notable for the  following components:   Glucose, Bld 119 (*)    Calcium  8.5 (*)    All other components within normal limits  CBC WITH DIFFERENTIAL/PLATELET - Abnormal; Notable for the following components:   Hemoglobin 11.4 (*)    HCT 35.8 (*)    All other components within normal limits    EKG: None  Radiology: DG Chest 2 View Result Date: 09/05/2024 CLINICAL DATA:  cough EXAM: CHEST - 2 VIEW COMPARISON:  Chest x-ray 07/22/2023. FINDINGS: The heart and mediastinal contours are within normal limits. Atherosclerotic plaque. No focal consolidation. No pulmonary edema. No pleural effusion. No pneumothorax. No acute osseous abnormality. IMPRESSION: 1. No active cardiopulmonary disease. 2.  Aortic Atherosclerosis (ICD10-I70.0). Electronically Signed   By: Morgane  Naveau M.D.   On: 09/05/2024 00:56     Procedures   Medications Ordered in the ED - No data to display                                  Medical Decision Making Amount and/or Complexity of Data Reviewed Labs: ordered. Radiology: ordered.    This patient presents to the ED for concern of cough, rhinorrhea, sore throat, this involves an extensive number of treatment options, and is a complaint that carries with it a high risk of complications and morbidity.  The differential diagnosis includes Flu/COVID/RSV, strep pharyngitis, sinusitis, peritonsillar abscess, retropharyngeal abscess, pneumonia, meningitis.   Co morbidities that complicate the patient evaluation  HTN, HLD, DM   Additional history obtained:  Dr. Newlin PCP   Problem List / ED Course / Critical interventions / Medication management  Patient presents to ED concern for rhinorrhea, sore throat, cough x 4 days.  Denies any other alarming symptoms such as chest pain or shortness of breath.  Patient stating her entire family is sick with same symptoms.  Physical exam reassuring.  Patient afebrile with stable vitals. I Ordered, and personally interpreted labs.  CBC without  leukocytosis.  There is mild anemia with hemoglobin 11.4.  BMP with mild hyperglycemia 119.  Respiratory panel positive for COVID. I ordered imaging studies including chest xray to assess for process contributing to patient's symptoms. I independently visualized and interpreted imaging which showed no active cardiopulmonary disease. I agree with the radiologist interpretation Shared all results with patient.  Answered all questions.  Offered to prescribe patient medications such as Paxlovid.  Patient declining this stating that she would prefer to drink her herbal teas.  Patient is otherwise tolerating this COVID diagnosis  well and appears overall well and healthy and NAD. Patient agrees to follow-up with PCP.   Staffed with Dr. Griselda. I have reviewed the patients home medicines and have made adjustments as needed The patient has been appropriately medically screened and/or stabilized in the ED. I have low suspicion for any other emergent medical condition which would require further screening, evaluation or treatment in the ED or require inpatient management. At time of discharge the patient is hemodynamically stable and in no acute distress. I have discussed work-up results and diagnosis with patient and answered all questions. Patient is agreeable with discharge plan. We discussed strict return precautions for returning to the emergency department and they verbalized understanding.     Social Determinants of Health:  Foreign language      Final diagnoses:  COVID    ED Discharge Orders     None          Hoy Nidia FALCON, [place] 09/05/24 0428    Griselda Norris, MD 09/05/24 [phone]
